# Patient Record
Sex: Male | Born: 1970 | Race: White | Hispanic: No | Marital: Married | Smoking: Never smoker
Health system: Southern US, Community
[De-identification: ages and names within clinical notes are randomized; demographics above are authoritative.]

## PROBLEM LIST (undated history)

## (undated) DIAGNOSIS — G43909 Migraine, unspecified, not intractable, without status migrainosus: Secondary | ICD-10-CM

## (undated) DIAGNOSIS — I509 Heart failure, unspecified: Secondary | ICD-10-CM

## (undated) DIAGNOSIS — E785 Hyperlipidemia, unspecified: Secondary | ICD-10-CM

## (undated) DIAGNOSIS — B2790 Infectious mononucleosis, unspecified without complication: Secondary | ICD-10-CM

## (undated) DIAGNOSIS — I5189 Other ill-defined heart diseases: Secondary | ICD-10-CM

## (undated) DIAGNOSIS — B259 Cytomegaloviral disease, unspecified: Secondary | ICD-10-CM

## (undated) HISTORY — DX: Hyperlipidemia, unspecified: E78.5

## (undated) HISTORY — DX: Infectious mononucleosis, unspecified without complication: B27.90

## (undated) HISTORY — PX: WISDOM TOOTH EXTRACTION: SHX21

## (undated) HISTORY — DX: Migraine, unspecified, not intractable, without status migrainosus: G43.909

## (undated) HISTORY — DX: Other ill-defined heart diseases: I51.89

## (undated) HISTORY — DX: Cytomegaloviral disease, unspecified: B25.9

## (undated) HISTORY — PX: COLONOSCOPY: SHX174

---

## 2008-07-20 ENCOUNTER — Emergency Department (HOSPITAL_COMMUNITY): Admission: EM | Admit: 2008-07-20 | Discharge: 2008-07-20 | Payer: Self-pay | Admitting: Emergency Medicine

## 2009-02-11 ENCOUNTER — Emergency Department (HOSPITAL_COMMUNITY): Admission: EM | Admit: 2009-02-11 | Discharge: 2009-02-12 | Payer: Self-pay | Admitting: Emergency Medicine

## 2009-02-21 ENCOUNTER — Encounter: Admission: RE | Admit: 2009-02-21 | Discharge: 2009-02-21 | Payer: Self-pay | Admitting: Family Medicine

## 2009-04-26 ENCOUNTER — Inpatient Hospital Stay (HOSPITAL_COMMUNITY): Admission: EM | Admit: 2009-04-26 | Discharge: 2009-04-27 | Payer: Self-pay | Admitting: Emergency Medicine

## 2010-09-01 ENCOUNTER — Encounter: Admission: RE | Admit: 2010-09-01 | Discharge: 2010-09-01 | Payer: Self-pay | Admitting: Family Medicine

## 2011-02-19 LAB — CSF CELL COUNT WITH DIFFERENTIAL
RBC Count, CSF: 0 /mm3
Tube #: 4
WBC, CSF: 2 /mm3 (ref 0–5)

## 2011-02-19 LAB — DIFFERENTIAL
Basophils Absolute: 0 10*3/uL (ref 0.0–0.1)
Basophils Relative: 0 % (ref 0–1)
Eosinophils Absolute: 0 10*3/uL (ref 0.0–0.7)
Eosinophils Relative: 0 % (ref 0–5)
Lymphocytes Relative: 20 % (ref 12–46)
Lymphs Abs: 0.9 10*3/uL (ref 0.7–4.0)
Monocytes Absolute: 0.3 10*3/uL (ref 0.1–1.0)
Monocytes Relative: 8 % (ref 3–12)
Neutro Abs: 3.2 10*3/uL (ref 1.7–7.7)
Neutrophils Relative %: 72 % (ref 43–77)

## 2011-02-19 LAB — CSF CULTURE W GRAM STAIN
Culture: NO GROWTH
Gram Stain: NONE SEEN

## 2011-02-19 LAB — COMPREHENSIVE METABOLIC PANEL
ALT: 19 U/L (ref 0–53)
AST: 23 U/L (ref 0–37)
Albumin: 3.8 g/dL (ref 3.5–5.2)
Alkaline Phosphatase: 39 U/L (ref 39–117)
BUN: 10 mg/dL (ref 6–23)
CO2: 29 mEq/L (ref 19–32)
Calcium: 9.2 mg/dL (ref 8.4–10.5)
Chloride: 101 mEq/L (ref 96–112)
Creatinine, Ser: 0.86 mg/dL (ref 0.4–1.5)
GFR calc Af Amer: >60 mL/min (ref >60)
GFR calc non Af Amer: >60 mL/min (ref >60)
Glucose, Bld: 139 mg/dL — ABNORMAL HIGH (ref 70–99)
Potassium: 3.7 mEq/L (ref 3.5–5.1)
Sodium: 138 mEq/L (ref 135–145)
Total Bilirubin: 0.7 mg/dL (ref 0.3–1.2)
Total Protein: 6.8 g/dL (ref 6.0–8.3)

## 2011-02-19 LAB — CULTURE, BLOOD (ROUTINE X 2)
Culture: NO GROWTH
Culture: NO GROWTH

## 2011-02-19 LAB — CBC
HCT: 39.9 % (ref 39.0–52.0)
Hemoglobin: 14 g/dL (ref 13.0–17.0)
MCHC: 35.1 g/dL (ref 30.0–36.0)
MCV: 83.6 fL (ref 78.0–100.0)
Platelets: 190 10*3/uL (ref 150–400)
RBC: 4.78 MIL/uL (ref 4.22–5.81)
RDW: 13.4 % (ref 11.5–15.5)
WBC: 4.5 10*3/uL (ref 4.0–10.5)

## 2011-02-19 LAB — ROCKY MTN SPOTTED FVR AB, IGG-BLOOD: RMSF IgG: 0.06 IV

## 2011-02-19 LAB — VDRL, CSF: VDRL Quant, CSF: NONREACTIVE

## 2011-02-19 LAB — PROTEIN, CSF: Total  Protein, CSF: 38 mg/dL (ref 15–45)

## 2011-02-19 LAB — CK: Total CK: 51 U/L (ref 7–232)

## 2011-02-19 LAB — GLUCOSE, CSF: Glucose, CSF: 66 mg/dL (ref 43–76)

## 2011-02-19 LAB — ROCKY MTN SPOTTED FVR AB, IGM-BLOOD: RMSF IgM: 0.11 IV (ref 0.00–0.89)

## 2011-02-21 LAB — URINALYSIS, ROUTINE W REFLEX MICROSCOPIC
Bilirubin Urine: NEGATIVE
Glucose, UA: NEGATIVE mg/dL
Hgb urine dipstick: NEGATIVE
Ketones, ur: NEGATIVE mg/dL
Nitrite: NEGATIVE
Protein, ur: NEGATIVE mg/dL
Specific Gravity, Urine: 1.013 (ref 1.005–1.030)
Urobilinogen, UA: 1 mg/dL (ref 0.0–1.0)
pH: 6.5 (ref 5.0–8.0)

## 2011-02-21 LAB — DIFFERENTIAL
Basophils Absolute: 0 10*3/uL (ref 0.0–0.1)
Basophils Relative: 0 % (ref 0–1)
Eosinophils Absolute: 0.2 10*3/uL (ref 0.0–0.7)
Eosinophils Relative: 3 % (ref 0–5)
Lymphocytes Relative: 56 % — ABNORMAL HIGH (ref 12–46)
Lymphs Abs: 4.5 10*3/uL — ABNORMAL HIGH (ref 0.7–4.0)
Monocytes Absolute: 0.5 10*3/uL (ref 0.1–1.0)
Monocytes Relative: 6 % (ref 3–12)
Neutro Abs: 2.8 10*3/uL (ref 1.7–7.7)
Neutrophils Relative %: 35 % — ABNORMAL LOW (ref 43–77)

## 2011-02-21 LAB — CBC
HCT: 33.9 % — ABNORMAL LOW (ref 39.0–52.0)
Hemoglobin: 12.2 g/dL — ABNORMAL LOW (ref 13.0–17.0)
MCHC: 36 g/dL (ref 30.0–36.0)
MCV: 84.4 fL (ref 78.0–100.0)
Platelets: 230 10*3/uL (ref 150–400)
RBC: 4.01 MIL/uL — ABNORMAL LOW (ref 4.22–5.81)
RDW: 13.8 % (ref 11.5–15.5)
WBC: 8.1 10*3/uL (ref 4.0–10.5)

## 2011-02-21 LAB — BASIC METABOLIC PANEL
BUN: 12 mg/dL (ref 6–23)
CO2: 24 mEq/L (ref 19–32)
Calcium: 8.6 mg/dL (ref 8.4–10.5)
Chloride: 103 mEq/L (ref 96–112)
Creatinine, Ser: 0.93 mg/dL (ref 0.4–1.5)
GFR calc Af Amer: >60 mL/min (ref >60)
GFR calc non Af Amer: >60 mL/min (ref >60)
Glucose, Bld: 107 mg/dL — ABNORMAL HIGH (ref 70–99)
Potassium: 3.9 mEq/L (ref 3.5–5.1)
Sodium: 134 mEq/L — ABNORMAL LOW (ref 135–145)

## 2011-02-21 LAB — MALARIA SMEAR

## 2011-03-27 NOTE — Discharge Summary (Signed)
NAME:  Kenneth Andrews, Kenneth Andrews            ACCOUNT NO.:  [id number]   MEDICAL RECORD NO.:  [id number]          PATIENT TYPE:  INP   LOCATION:  1441                         FACILITY:  [facility]   PHYSICIAN:  Corinna L. Lendell Caprice, MDDATE OF BIRTH:  1970/12/01   DATE OF ADMISSION:  04/26/2009  DATE OF DISCHARGE:  04/27/2009                               DISCHARGE SUMMARY   DISCHARGE DIAGNOSES:  1. Varicella infection.  2. Transient hypotension.   DISCHARGE MEDICATIONS:  1. Acyclovir 500 mg p.o. q.i.d. until gone.  2. Antihistamines as needed for itching.  3. Stop azithromycin.  4. Continue ibuprofen or Tylenol as needed for fever or pain.  5. Dilaudid 2 mg one to two p.o. q.4 hours p.r.n. severe pain.   CONDITION:  Stable.   ACTIVITY:  As tolerated but do not return to work until rash resolved.   DIET:  Plenty of liquids, regular.   FOLLOWUP:  With Dr. Laurine Blazer as needed.   CONSULTATIONS:  None.   PROCEDURES:  Lumbar puncture by ED physician.   LABS:  CBC unremarkable.  Complete metabolic panel significant for  glucose of 139, otherwise unremarkable.  CPK is 51.  Lumbar puncture  showed 2 white cells, zero red cells, CSF glucose was 66, CSF protein  38, CSF culture showed no white cells, no organisms and is pending.  Blood cultures pending.   SPECIAL STUDIES:  Radiology:  Chest x-ray showed no infiltrate.  Outpatient CAT scan of the brain done prior to admission showed nothing  acute.   HISTORY AND HOSPITAL COURSE:  Kenneth Andrews is a 40 year old white male  who began having headache and fever the day prior to presenting to the  emergency room.  He was seen in an Urgent Care Center the day prior to  admission and he had no rash at that time.  He was given a prescription  for Percocet and Z-Pak.  He had no relief from his headache and he  continued to spike fevers up to 102 - 103 and he called the Urgent Care  Center.  They recommended patient present to the emergency room.  The  patient initially was normotensive but then dropped into the 80s.  He  began developing an erythematous papular rash but when the ED physician  evaluated him he had no vesicular characteristics to the rash.  She also  did a lumbar puncture to rule out meningitis.  When I evaluated patient  his rash had progressed and he did have rare vesicular lesions on an  erythematous base.  He was admitted to telemetry as initially he was  tachycardic, especially in light of the hypotension.  He had no further  hypotension after fluid bolus.  He was placed in respiratory and contact  isolation.  He has never had varicella infection previously nor has he  had the vaccine.  He had no signs of a varicella pneumonia.  He was  started on acyclovir and antihistamines.  At the time of discharge his  blood pressure was normal.  He was still spiking intermittent fevers but  his headache was better.  He was tolerating a diet.  His rash has  progressed and there is quite a few more vesicular lesions.  I have  instructed him to stop his Z-Pak.  I have given him a prescription for  Dilaudid as the Percocet did not really help his headache much.   Total time on the day of discharge is 40 minutes.      Corinna L. Lendell Caprice, MD  Electronically Signed     CLS/MEDQ  D:  04/27/2009  T:  04/27/2009  Job:  [id number]   cc:   Johnn Hai, MS

## 2011-03-27 NOTE — H&P (Signed)
NAME:  Kenneth Andrews, Kenneth Andrews            ACCOUNT NO.:  [id number]   MEDICAL RECORD NO.:  [id number]          PATIENT TYPE:  INP   LOCATION:  0105                         FACILITY:  [facility]   PHYSICIAN:  Corinna L. Lendell Caprice, MDDATE OF BIRTH:  November 10, 1971   DATE OF ADMISSION:  04/26/2009  DATE OF DISCHARGE:                              HISTORY & PHYSICAL   CHIEF COMPLAINT:  Headache, fever, rash.   HISTORY OF PRESENT ILLNESS:  [name] is a 40 year old, white male  who was seen in [facility] in [facility] yesterday for  headache and fever.  He had a fever of 101.5, pulse 120, and a blood  pressure of 114/66 yesterday.  He had a CT scan of the brain without  contrast which was unremarkable.  He had normal white blood cell count  and complete metabolic panel.  He was given a Z-Pak and Percocet and  told to take ibuprofen and rest.  He apparently called back to the  Urgent Care Center who recommended that he present to the Emergency Room  today for continued symptoms.  He has since developed a pruritic rash  over his trunk, arms, and face.  He has never had varicella infection or  vaccine.  He has a young child, and about 2 weeks ago, there was a  birthday party with multiple kids none of which had known illness.  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  Laurine Blazer.  I have no records from her office.  The patient reports his  headache is frontal in nature.  He has no neck stiffness.  He feels  generalized malaise.  He is able to ambulate without difficulty.  He has  had no lightheadedness or dizziness.  He has no cough or shortness of  breath.   PAST MEDICAL HISTORY:  As above.   MEDICATIONS:  1. Azithromycin.  2. Percocet has provided no relief from his headache.   SOCIAL HISTORY:  He is married.  He does not smoke, drink, or use drugs.  He traveled to the [place] in [place].   FAMILY HISTORY:  His mother has metastatic breast cancer.   His maternal  grandmother had pancreatic cancer.  His father died of an MI in his 17s.   REVIEW OF SYSTEMS:  CONSTITUTIONAL:  As above.  No weight loss.  No  weight gain.  HEENT:  He also has some lesions developing on his tongue.  He has no sore throat, rhinorrhea, sinus congestion, visual changes.  RESPIRATORY:  As above.  CARDIOVASCULAR: No chest pains or palpitations.  GI:  His appetite has been good.  He has had no vomiting or diarrhea.  GU:  No dysuria or hematuria.  MUSCULOSKELETAL: He has myalgias as  stated above.  INFECTIOUS:  He reportedly was tested for HIV by Dr.  Laurine Blazer as well as other tests.  ENDOCRINE:  No diabetes.  HEMATOLOGIC:  No history of bleeding or clotting disorders.  SKIN:  As above.  PSYCHIATRIC:  No depression.  NEUROLOGIC:  No confusion, no seizures.   PHYSICAL EXAMINATION:  VITAL  SIGNS:  His temperature in the Emergency  Room was 100.9.  Blood pressure initially was 115/55 and dropped  transiently to 84/48 but currently back up to 110.  Heart rate initially  114, currently 90.  Respiratory rate 16-22.  Oxygen saturation 95% on  room air.  GENERAL:  The patient is in no acute distress.  He is talking on the  telephone and sitting upright with his legs crossed.  HEENT:  Pupils equal, round, and reactive to light.  Sclerae nonicteric.  No conjunctivitis.  Nares without drainage.  He has some vesicular  lesions over his tongue.  NECK:  Supple.  Shotty submandibular lymphadenopathy.  LUNGS:  Clear to auscultation bilaterally without wheezes, rhonchi, or  rales.  CARDIOVASCULAR:  Regular rate and rhythm without murmurs, gallops, or  rubs.  ABDOMEN:  Normal bowel sounds.  Soft, nontender, nondistended.  GU: The patient refused.  RECTAL:  The patient refused.  EXTREMITIES:  No clubbing, cyanosis, or edema.  SKIN:  The patient has multiple, erythematous papules and some with  crusting and some which are vesicular along his hairline, scalp, face,  ears, torso,  arms, hands, back, relative sparing of his legs and feet.  NEUROLOGIC:  Alert and oriented.  Cranial nerves and sensorimotor exam  are intact.  PSYCHIATRIC:  Normal affect.   LABS:  CBC normal with a normal differential.  White blood cell count is  4500.  Complete metabolic panel significant for a glucose of 139,  otherwise unremarkable.  Lumbar puncture done by Dr. Rosalia Hammers in tube 4 is  colorless, clear, 2 white cells, 0 red cells.  CSF glucose is 66.  CSF  protein 38.  Chest x-ray shows no infiltrate.  CT of the brain done as  an outpatient was normal with and without contrast.   ASSESSMENT/PLAN:  1. Varicella infection with transient hypotension.  The patient will      be admitted for IV fluids and monitoring.  He will be in a negative      pressure respiratory isolation and contact isolation area.  I have      notified the nurses to notify infection control nurse.  The patient      has received acyclovir which I will continue.  Also give      antihistamines and Tylenol.  For his headache, try Toradol and      Dilaudid as needed.  His pressure is currently better, and he does      not appear toxic.  Much less likely would be [facility] spotted      fever; however, for now, cover with doxycycline.  2. Reported history of cytomegalovirus.  We will get outpatient      records.      Corinna L. Lendell Caprice, MD  Electronically Signed     CLS/MEDQ  D:  04/26/2009  T:  04/26/2009  Job:  [id number]   cc:   Dr. Laurine Blazer

## 2011-08-15 LAB — DIFFERENTIAL
Basophils Absolute: 0
Basophils Relative: 0
Eosinophils Absolute: 0.1
Eosinophils Relative: 1
Lymphocytes Relative: 27
Lymphs Abs: 2.2
Monocytes Absolute: 0.5
Monocytes Relative: 6
Neutro Abs: 5.3
Neutrophils Relative %: 66

## 2011-08-15 LAB — COMPREHENSIVE METABOLIC PANEL
ALT: 16
AST: 17
Albumin: 4.1
Alkaline Phosphatase: 42
BUN: 11
CO2: 30
Calcium: 9.6
Chloride: 105
Creatinine, Ser: 0.78
GFR calc Af Amer: >60
GFR calc non Af Amer: >60
Glucose, Bld: 91
Potassium: 3.7
Sodium: 141
Total Bilirubin: 1.1
Total Protein: 7.1

## 2011-08-15 LAB — POCT CARDIAC MARKERS
CKMB, poc: 1.4
CKMB, poc: <1 — ABNORMAL LOW
Myoglobin, poc: 57.3
Myoglobin, poc: 72.2
Troponin i, poc: <0.05
Troponin i, poc: <0.05

## 2011-08-15 LAB — CBC
HCT: 43
Hemoglobin: 14.8
MCHC: 34.3
MCV: 85.9
Platelets: 274
RBC: 5.01
RDW: 13.2
WBC: 8.1

## 2011-11-25 ENCOUNTER — Emergency Department (HOSPITAL_BASED_OUTPATIENT_CLINIC_OR_DEPARTMENT_OTHER)
Admission: EM | Admit: 2011-11-25 | Discharge: 2011-11-25 | Disposition: A | Discharge status: Home / Self Care | Payer: BC Managed Care – PPO | Attending: Emergency Medicine | Admitting: Emergency Medicine

## 2011-11-25 ENCOUNTER — Emergency Department (INDEPENDENT_AMBULATORY_CARE_PROVIDER_SITE_OTHER): Payer: BC Managed Care – PPO

## 2011-11-25 ENCOUNTER — Other Ambulatory Visit: Payer: Self-pay

## 2011-11-25 ENCOUNTER — Encounter (HOSPITAL_BASED_OUTPATIENT_CLINIC_OR_DEPARTMENT_OTHER): Payer: Self-pay | Admitting: *Deleted

## 2011-11-25 DIAGNOSIS — R079 Chest pain, unspecified: Secondary | ICD-10-CM

## 2011-11-25 DIAGNOSIS — R42 Dizziness and giddiness: Secondary | ICD-10-CM | POA: Insufficient documentation

## 2011-11-25 LAB — BASIC METABOLIC PANEL
BUN: 14 mg/dL (ref 6–23)
CO2: 27 mEq/L (ref 19–32)
Calcium: 9.6 mg/dL (ref 8.4–10.5)
Chloride: 105 mEq/L (ref 96–112)
Creatinine, Ser: 0.9 mg/dL (ref 0.50–1.35)
GFR calc Af Amer: >90 mL/min (ref >90)
GFR calc non Af Amer: >90 mL/min (ref >90)
Glucose, Bld: 140 mg/dL — ABNORMAL HIGH (ref 70–99)
Potassium: 3.6 mEq/L (ref 3.5–5.1)
Sodium: 140 mEq/L (ref 135–145)

## 2011-11-25 LAB — DIFFERENTIAL
Basophils Absolute: 0 10*3/uL (ref 0.0–0.1)
Basophils Relative: 0 % (ref 0–1)
Eosinophils Absolute: 0.2 10*3/uL (ref 0.0–0.7)
Eosinophils Relative: 2 % (ref 0–5)
Lymphocytes Relative: 37 % (ref 12–46)
Lymphs Abs: 3.5 10*3/uL (ref 0.7–4.0)
Monocytes Absolute: 0.6 10*3/uL (ref 0.1–1.0)
Monocytes Relative: 7 % (ref 3–12)
Neutro Abs: 5 10*3/uL (ref 1.7–7.7)
Neutrophils Relative %: 54 % (ref 43–77)

## 2011-11-25 LAB — CBC
HCT: 40.5 % (ref 39.0–52.0)
Hemoglobin: 14.7 g/dL (ref 13.0–17.0)
MCH: 29.2 pg (ref 26.0–34.0)
MCHC: 36.3 g/dL — ABNORMAL HIGH (ref 30.0–36.0)
MCV: 80.5 fL (ref 78.0–100.0)
Platelets: 271 10*3/uL (ref 150–400)
RBC: 5.03 MIL/uL (ref 4.22–5.81)
RDW: 13 % (ref 11.5–15.5)
WBC: 9.3 10*3/uL (ref 4.0–10.5)

## 2011-11-25 LAB — TROPONIN I: Troponin I: <0.3 ng/mL (ref <0.30)

## 2011-11-25 MED ORDER — KETOROLAC TROMETHAMINE 30 MG/ML IJ SOLN
30.0000 mg | Freq: Once | INTRAMUSCULAR | Status: AC
  Administered 2011-11-25: 30 mg via INTRAVENOUS
  Filled 2011-11-25: qty 1

## 2011-11-25 NOTE — ED Notes (Addendum)
Pt states his dad died of a massive heart attack at age 41. Earlier today he was not doing anything and had a sudden onset of left chest pain radiating to left arm. Light-headed, but no other s/s. Taken to [facility]. EKG done. Pt placed on monitor.

## 2011-11-25 NOTE — ED Provider Notes (Signed)
History   This chart was scribed for Kenneth Christen, MD scribed by [name]. The patient was seen in room MH12/MH12 seen at 21:38.   CSN: [id number]  Arrival date & time 11/25/11  2058   First MD Initiated Contact with Patient 11/25/11 2121      Chief Complaint  Patient presents with  . Chest Pain    (Consider location/radiation/quality/duration/timing/severity/associated sxs/prior treatment) HPI Kenneth Andrews is a 41 y.o. male who presents to the Emergency Department complaining of gradually improving constant left chest pain with associated pain radiating to his left arm and lightheadedness. Pt reports he was watching his son playing in the family room when he experienced pain and lightheadedness lasting about 20-30 minutes. He adds that he took an aspirin with improvement of his left arm. Denies cough, fevers, SOB, difficulty breathing, and nausea, syncope, allergies or heart palpitations. Pt also denies taking any medications besides vitamins and an occasional baby aspirin. Pt has a family history of heart disease with his father passing at the age of 47 from a massive HA and his grandmother also had a HA in her 61s. Both pt reports were smokers. Pt says he has had normal stress tests and that they were done about 6-7 years ago.  PCP: Dr. Jennette Bill  History reviewed. No pertinent past medical history.  History reviewed. No pertinent past surgical history.  History reviewed. No pertinent family history.  History  Substance Use Topics  . Smoking status: Never Smoker   . Smokeless tobacco: Not on file  . Alcohol Use: No      Review of Systems  Constitutional: Negative.  Negative for fever and chills.  HENT: Negative.   Eyes: Negative.  Negative for discharge and redness.  Respiratory: Negative.  Negative for cough and shortness of breath.   Cardiovascular: Positive for chest pain.  Gastrointestinal: Negative.  Negative for nausea, vomiting and abdominal pain.    Genitourinary: Negative.  Negative for hematuria.  Musculoskeletal: Negative.  Negative for back pain.  Skin: Negative.  Negative for color change and rash.  Neurological: Positive for light-headedness. Negative for syncope and headaches.  Hematological: Negative.  Negative for adenopathy.  Psychiatric/Behavioral: Negative.  Negative for confusion.  All other systems reviewed and are negative.    Allergies  Review of patient's allergies indicates no known allergies.  Home Medications   Current Outpatient Rx  Name Route Sig Dispense Refill  . ASPIRIN 325 MG PO TBEC Oral Take 750 mg by mouth once as needed. For chest tightness      BP 144/86  Pulse 90  Temp(Src) 98.2 F (36.8 C) (Oral)  Resp 20  Ht 5\' 6"  (1.676 m)  Wt 180 lb (81.647 kg)  BMI 29.05 kg/m2  SpO2 97%  Physical Exam  Nursing note and vitals reviewed. Constitutional: He is oriented to person, place, and time. He appears well-developed and well-nourished. No distress.  HENT:  Head: Normocephalic and atraumatic.  Eyes: EOM are normal. Pupils are equal, round, and reactive to light.  Neck: Normal range of motion. Neck supple. No tracheal deviation present.  Cardiovascular: Normal rate, regular rhythm and normal heart sounds.  Exam reveals no gallop and no friction rub.   No murmur heard. Pulmonary/Chest: Effort normal and breath sounds normal. No respiratory distress. He has no wheezes. He has no rales.  Abdominal: Soft. Bowel sounds are normal. He exhibits no distension.  Musculoskeletal: Normal range of motion. He exhibits no edema.  Neurological: He is alert and oriented to person,  place, and time. No sensory deficit.  Skin: Skin is warm and dry.  Psychiatric: He has a normal mood and affect. His behavior is normal.    ED Course  Procedures (including critical care time) DIAGNOSTIC STUDIES: Oxygen Saturation is 97% on room air, normal by my interpretation.    COORDINATION OF CARE: Results for orders  placed during the hospital encounter of 11/25/11  CBC      Component Value Range   WBC 9.3  4.0 - 10.5 (K/uL)   RBC 5.03  4.22 - 5.81 (MIL/uL)   Hemoglobin 14.7  13.0 - 17.0 (g/dL)   HCT 16.1  09.6 - 04.5 (%)   MCV 80.5  78.0 - 100.0 (fL)   MCH 29.2  26.0 - 34.0 (pg)   MCHC 36.3 (*) 30.0 - 36.0 (g/dL)   RDW 40.9  81.1 - 91.4 (%)   Platelets 271  150 - 400 (K/uL)  DIFFERENTIAL      Component Value Range   Neutrophils Relative 54  43 - 77 (%)   Neutro Abs 5.0  1.7 - 7.7 (K/uL)   Lymphocytes Relative 37  12 - 46 (%)   Lymphs Abs 3.5  0.7 - 4.0 (K/uL)   Monocytes Relative 7  3 - 12 (%)   Monocytes Absolute 0.6  0.1 - 1.0 (K/uL)   Eosinophils Relative 2  0 - 5 (%)   Eosinophils Absolute 0.2  0.0 - 0.7 (K/uL)   Basophils Relative 0  0 - 1 (%)   Basophils Absolute 0.0  0.0 - 0.1 (K/uL)  BASIC METABOLIC PANEL      Component Value Range   Sodium 140  135 - 145 (mEq/L)   Potassium 3.6  3.5 - 5.1 (mEq/L)   Chloride 105  96 - 112 (mEq/L)   CO2 27  19 - 32 (mEq/L)   Glucose, Bld 140 (*) 70 - 99 (mg/dL)   BUN 14  6 - 23 (mg/dL)   Creatinine, Ser 7.82  0.50 - 1.35 (mg/dL)   Calcium 9.6  8.4 - 95.6 (mg/dL)   GFR calc non Af Amer >90  >90 (mL/min)   GFR calc Af Amer >90  >90 (mL/min)  TROPONIN I      Component Value Range   Troponin I <0.30  <0.30 (ng/mL)      No diagnosis found.   Date: 11/25/2011  Rate: 89  Rhythm: normal sinus rhythm  QRS Axis: normal  Intervals: normal  ST/T Wave abnormalities: normal  Conduction Disutrbances:none  Narrative Interpretation:   Old EKG Reviewed: unchanged from 07/20/2008    MDM  Patient with symptoms that are atypical for acute coronary syndrome given that his symptoms began at rest.  They were not worsened by any movement or exertion.  Patient is low risk by both the heart score criteria in the TIMI score criteria, he scores only a one in each of these scales for his family history and aspirin use.  Patient's symptoms have spontaneously  improved by the time of his arrival here.  He has a normal EKG and a normal troponin and given that his pain started at approximately 2:30 this afternoon and it was maximal at that time I do not feel the patient needs a repeat cardiac marker since it is 7 hours later.  Patient does have a primary care physician I will advise him to followup with his primary care physician this week for reevaluation and outpatient stress testing to be completed.  I will advise him to  return for worsening chest pain, shortness of breath or other concerns.   I personally performed the services described in this documentation, which was scribed in my presence. The recorded information has been reviewed and considered.        Kenneth Christen, MD 11/25/11 2206

## 2013-06-14 ENCOUNTER — Emergency Department (HOSPITAL_COMMUNITY): Payer: BC Managed Care – PPO

## 2013-06-14 ENCOUNTER — Encounter (HOSPITAL_COMMUNITY): Payer: Self-pay | Admitting: Emergency Medicine

## 2013-06-14 ENCOUNTER — Inpatient Hospital Stay (HOSPITAL_COMMUNITY)
Admission: EM | Admit: 2013-06-14 | Discharge: 2013-06-16 | DRG: 532 | Disposition: A | Discharge status: Home / Self Care | Payer: BC Managed Care – PPO | Attending: Internal Medicine | Admitting: Internal Medicine

## 2013-06-14 DIAGNOSIS — Z79899 Other long term (current) drug therapy: Secondary | ICD-10-CM

## 2013-06-14 DIAGNOSIS — Z7982 Long term (current) use of aspirin: Secondary | ICD-10-CM

## 2013-06-14 DIAGNOSIS — G458 Other transient cerebral ischemic attacks and related syndromes: Principal | ICD-10-CM | POA: Diagnosis present

## 2013-06-14 DIAGNOSIS — F411 Generalized anxiety disorder: Secondary | ICD-10-CM | POA: Diagnosis present

## 2013-06-14 DIAGNOSIS — I5032 Chronic diastolic (congestive) heart failure: Secondary | ICD-10-CM | POA: Diagnosis present

## 2013-06-14 DIAGNOSIS — I509 Heart failure, unspecified: Secondary | ICD-10-CM | POA: Diagnosis present

## 2013-06-14 DIAGNOSIS — G459 Transient cerebral ischemic attack, unspecified: Secondary | ICD-10-CM

## 2013-06-14 DIAGNOSIS — R079 Chest pain, unspecified: Secondary | ICD-10-CM | POA: Diagnosis present

## 2013-06-14 DIAGNOSIS — H532 Diplopia: Secondary | ICD-10-CM | POA: Diagnosis present

## 2013-06-14 DIAGNOSIS — E781 Pure hyperglyceridemia: Secondary | ICD-10-CM | POA: Diagnosis present

## 2013-06-14 DIAGNOSIS — R29810 Facial weakness: Secondary | ICD-10-CM | POA: Diagnosis present

## 2013-06-14 LAB — COMPREHENSIVE METABOLIC PANEL
ALT: 14 U/L (ref 0–53)
AST: 15 U/L (ref 0–37)
Albumin: 4.1 g/dL (ref 3.5–5.2)
Alkaline Phosphatase: 52 U/L (ref 39–117)
BUN: 13 mg/dL (ref 6–23)
CO2: 23 mEq/L (ref 19–32)
Calcium: 9.8 mg/dL (ref 8.4–10.5)
Chloride: 104 mEq/L (ref 96–112)
Creatinine, Ser: 0.84 mg/dL (ref 0.50–1.35)
GFR calc Af Amer: >90 mL/min (ref >90)
GFR calc non Af Amer: >90 mL/min (ref >90)
Glucose, Bld: 94 mg/dL (ref 70–99)
Potassium: 3.9 mEq/L (ref 3.5–5.1)
Sodium: 142 mEq/L (ref 135–145)
Total Bilirubin: 0.2 mg/dL — ABNORMAL LOW (ref 0.3–1.2)
Total Protein: 7.1 g/dL (ref 6.0–8.3)

## 2013-06-14 LAB — DIFFERENTIAL
Basophils Absolute: 0 10*3/uL (ref 0.0–0.1)
Basophils Relative: 0 % (ref 0–1)
Eosinophils Absolute: 0.2 10*3/uL (ref 0.0–0.7)
Eosinophils Relative: 2 % (ref 0–5)
Lymphocytes Relative: 44 % (ref 12–46)
Lymphs Abs: 3.8 10*3/uL (ref 0.7–4.0)
Monocytes Absolute: 0.5 10*3/uL (ref 0.1–1.0)
Monocytes Relative: 6 % (ref 3–12)
Neutro Abs: 4.1 10*3/uL (ref 1.7–7.7)
Neutrophils Relative %: 47 % (ref 43–77)

## 2013-06-14 LAB — CBC
HCT: 40.5 % (ref 39.0–52.0)
Hemoglobin: 14.7 g/dL (ref 13.0–17.0)
MCH: 30.4 pg (ref 26.0–34.0)
MCHC: 36.3 g/dL — ABNORMAL HIGH (ref 30.0–36.0)
MCV: 83.7 fL (ref 78.0–100.0)
Platelets: 278 10*3/uL (ref 150–400)
RBC: 4.84 MIL/uL (ref 4.22–5.81)
RDW: 12.9 % (ref 11.5–15.5)
WBC: 8.6 10*3/uL (ref 4.0–10.5)

## 2013-06-14 LAB — POCT I-STAT TROPONIN I: Troponin i, poc: 0.01 ng/mL (ref 0.00–0.08)

## 2013-06-14 LAB — APTT: aPTT: 32 seconds (ref 24–37)

## 2013-06-14 LAB — TROPONIN I: Troponin I: <0.3 ng/mL (ref <0.30)

## 2013-06-14 LAB — PROTIME-INR
INR: 1 (ref 0.00–1.49)
Prothrombin Time: 13 seconds (ref 11.6–15.2)

## 2013-06-14 MED ORDER — ASPIRIN EC 81 MG PO TBEC
81.0000 mg | DELAYED_RELEASE_TABLET | Freq: Every day | ORAL | Status: DC
  Administered 2013-06-15 – 2013-06-16 (×2): 81 mg via ORAL
  Filled 2013-06-14 (×2): qty 1

## 2013-06-14 NOTE — ED Notes (Addendum)
Pt arrived to ED via POV with wife and child. Pt A & O with c/o R sided facial droop noted @ 20 min ago.  pt states onset of L arm pain and L chest pain @ 1530 today, pt states he did have slight HA at same time but since has went away, pt states he did have "funny" feeling in L side of face.  Per wife Banker) pt never became disoriented, pt denies aphasia or difficulty swallowing. Pt A & O at this time R side facial droop noted, pt confirms L neck discomfort

## 2013-06-14 NOTE — ED Notes (Signed)
Arrived from [place] for ?stroke.  Rapid response and Dr. Leroy Kennedy

## 2013-06-14 NOTE — ED Provider Notes (Signed)
CSN: [id number]     Arrival date & time 06/14/13  2142 History     First MD Initiated Contact with Patient 06/14/13 2156     Chief Complaint  Patient presents with  . Facial Droop   (Consider location/radiation/quality/duration/timing/severity/associated sxs/prior Treatment) HPI Comments: New onset facial droop that started 20 minutes ago. Wife noticed while they were watching TV.   Patient is a 42 y.o. male presenting with neurologic complaint. The history is provided by the patient.  Neurologic Problem This is a new problem. The current episode started less than 1 hour ago. The problem occurs constantly. The problem has not changed since onset.Pertinent negatives include no chest pain, no abdominal pain, no headaches and no shortness of breath. Nothing aggravates the symptoms. Nothing relieves the symptoms. He has tried nothing for the symptoms.    History reviewed. No pertinent past medical history. History reviewed. No pertinent past surgical history. No family history on file. History  Substance Use Topics  . Smoking status: Never Smoker   . Smokeless tobacco: Not on file  . Alcohol Use: No    Review of Systems  Constitutional: Negative for fever and chills.  Respiratory: Negative for shortness of breath.   Cardiovascular: Negative for chest pain.  Gastrointestinal: Negative for abdominal pain.  Neurological: Negative for headaches.  All other systems reviewed and are negative.    Allergies  Review of patient's allergies indicates no known allergies.  Home Medications   Current Outpatient Rx  Name  Route  Sig  Dispense  Refill  . aspirin 325 MG EC tablet   Oral   Take 750 mg by mouth once as needed. For chest tightness          BP 124/76  Pulse 97  Temp(Src) 98.4 F (36.9 C) (Oral)  Resp 18  Ht 5\' 6"  (1.676 m)  Wt 175 lb (79.379 kg)  BMI 28.26 kg/m2  SpO2 100% Physical Exam  Nursing note and vitals reviewed. Constitutional: He is oriented to person,  place, and time. He appears well-developed and well-nourished. No distress.  HENT:  Head: Normocephalic and atraumatic.  Mouth/Throat: No oropharyngeal exudate.  Eyes: EOM are normal. Pupils are equal, round, and reactive to light.  Neck: Normal range of motion. Neck supple.  Cardiovascular: Normal rate and regular rhythm.  Exam reveals no friction rub.   No murmur heard. Pulmonary/Chest: Effort normal and breath sounds normal. No respiratory distress. He has no wheezes. He has no rales.  Abdominal: He exhibits no distension. There is no tenderness. There is no rebound.  Musculoskeletal: Normal range of motion. He exhibits no edema.  Neurological: He is alert and oriented to person, place, and time. A cranial nerve deficit is present. No sensory deficit. He exhibits normal muscle tone. Coordination and gait normal. GCS eye subscore is 4. GCS verbal subscore is 5. GCS motor subscore is 6.  Mild R facial droop. Mild loss of R nasolabial fold  Skin: He is not diaphoretic.    ED Course   Procedures (including critical care time)  Labs Reviewed - No data to display Ct Head Wo Contrast  06/14/2013   *RADIOLOGY REPORT*  Clinical Data: Facial droop, possible stroke  CT HEAD WITHOUT CONTRAST  Technique:  Contiguous axial images were obtained from the base of the skull through the vertex without contrast.  Comparison: None.  Findings: Mild atrophy for the patient's young age.  No acute intracranial hemorrhage, definite infarction, mass lesion, midline shift, herniation, hydrocephalus, or extra-axial fluid collection.  Gray-white matter differentiation maintained.  Cisterns patent.  No cerebellar abnormality.  Orbits unremarkable.  Mastoids clear. Sinuses demonstrate polypoid mucosal thickening in the right maxillary and left sphenoid sinuses.  IMPRESSION: No acute intracranial process.  Mild atrophy.  Findings called to Dr.Demarco Bacci at 10:20 p.m.   Original Report Authenticated By: Judie Petit. Shick, M.D.   1.  Facial droop     MDM  42 year old male with acute onset of R sided facial droop. Began about 20 minutes ago. Very mild R facial droop. Patient denies any facial tingling. He does report some dull L lateral chest pain previously.  Here, vitals stable. Patient with mild R facial droop. Forehead spared. Concern for central cause, Code Stroke initiated.  CT reviewed by me, read as negative. I spoke to the radiologist on the phone. I spoke with Dr. Leroy Kennedy, who accepted patient at [facility]. Transferred to [facility] urgently with [facility].  Dagmar Hait, MD 06/14/13 [phone]

## 2013-06-14 NOTE — Consult Note (Signed)
Referring Physician: [facility]    Chief Complaint: CODE STRKE: right face weakness and left arm numbness-pain.  HPI:                                                                                                                                         Kenneth Andrews is an 42 y.o. male, right handed, with no significant past medical history, transferred to [facility] ED as a code stroke with the above mentioned symptoms. He was last seen well at 8:50 pm today when developed chest pain followed by numbness and pain left upper extremity and then his wife noticed droopiness of the right face. He was taken to [facility] ED where he was reported to have right face weakness but no other focal findings. Never had similar symptoms before. Denies associated headache, vertigo, double vision, difficulty swallowing, unsteadiness, weakness left or right hemibody, slurred speech, language or vision impairment. Upon arrival to [facility] ED he had virtual resolution of his left sided numbness and NIHSS was 0. The overall duration of his symptoms was 30 minutes. CT brain showed no acute intracranial abnormality.   Date last known well: 06/14/13 Time last known well: 8:50 pm tPA Given: no, NIHSS 0 NIHSS: 0 MRS:   History reviewed. No pertinent past medical history.  History reviewed. No pertinent past surgical history.  No family history on file. Social History:  reports that he has never smoked. He does not have any smokeless tobacco history on file. He reports that he does not drink alcohol or use illicit drugs.  Allergies: No Known Allergies  Medications:                                                                                                                           I have reviewed the patient's current medications.  ROS:  History obtained from the patient  General ROS: negative for -  chills, fatigue, fever, night sweats, weight gain or weight loss Psychological ROS: negative for - behavioral disorder, hallucinations, memory difficulties, mood swings or suicidal ideation Ophthalmic ROS: negative for - blurry vision, double vision, eye pain or loss of vision ENT ROS: negative for - epistaxis, nasal discharge, oral lesions, sore throat, tinnitus or vertigo Allergy and Immunology ROS: negative for - hives or itchy/watery eyes Hematological and Lymphatic ROS: negative for - bleeding problems, bruising or swollen lymph nodes Endocrine ROS: negative for - galactorrhea, hair pattern changes, polydipsia/polyuria or temperature intolerance Respiratory ROS: negative for - cough, hemoptysis, shortness of breath or wheezing Cardiovascular ROS: negative for - dyspnea on exertion, edema or irregular heartbeat Gastrointestinal ROS: negative for - abdominal pain, diarrhea, hematemesis, nausea/vomiting or stool incontinence Genito-Urinary ROS: negative for - dysuria, hematuria, incontinence or urinary frequency/urgency Musculoskeletal ROS: negative for - joint swelling or muscular weakness Neurological ROS: as noted in HPI Dermatological ROS: negative for rash and skin lesion changes      Physical exam: pleasant male in no apparent distress.Blood pressure 116/77, pulse 88, temperature 98.4 F (36.9 C), temperature source Oral, resp. rate 13, height 5\' 6"  (1.676 m), weight 79.379 kg (175 lb), SpO2 98.00%.  Head: normocephalic. Neck: supple, no bruits, no JVD. Cardiac: no murmurs. Lungs: clear. Abdomen: soft, no tender, no mass. Extremities: no edema.  Neurologic Examination:                                                                                                      Mental Status: Alert, oriented, thought content appropriate.  Speech fluent without evidence of aphasia.  Able to follow 3 step commands without difficulty. Cranial Nerves: II: Discs flat bilaterally; Visual  fields grossly normal, pupils equal, round, reactive to light and accommodation III,IV, VI: ptosis not present, extra-ocular motions intact bilaterally V,VII: smile symmetric, facial light touch sensation normal bilaterally VIII: hearing normal bilaterally IX,X: gag reflex present XI: bilateral shoulder shrug XII: midline tongue extension Motor: Right : Upper extremity   5/5    Left:     Upper extremity   5/5  Lower extremity   5/5     Lower extremity   5/5 Tone and bulk:normal tone throughout; no atrophy noted Sensory: Pinprick and light touch intact throughout, bilaterally Deep Tendon Reflexes:  2 all over  Plantars: Right: downgoing   Left: downgoing Cerebellar: normal finger-to-nose,  normal heel-to-shin test Gait:  No ataxia. CV: pulses palpable throughout    Results for orders placed during the hospital encounter of 06/14/13 (from the past 48 hour(s))  CBC     Status: Abnormal   Collection Time    06/14/13  9:59 PM      Result Value Range   WBC 8.6  4.0 - 10.5 K/uL   RBC 4.84  4.22 - 5.81 MIL/uL   Hemoglobin 14.7  13.0 - 17.0 g/dL   HCT 96.0  45.4 - 09.8 %   MCV 83.7  78.0 - 100.0 fL   MCH 30.4  26.0 -  34.0 pg   MCHC 36.3 (*) 30.0 - 36.0 g/dL   RDW 16.1  09.6 - 04.5 %   Platelets 278  150 - 400 K/uL  DIFFERENTIAL     Status: None   Collection Time    06/14/13  9:59 PM      Result Value Range   Neutrophils Relative % 47  43 - 77 %   Neutro Abs 4.1  1.7 - 7.7 K/uL   Lymphocytes Relative 44  12 - 46 %   Lymphs Abs 3.8  0.7 - 4.0 K/uL   Monocytes Relative 6  3 - 12 %   Monocytes Absolute 0.5  0.1 - 1.0 K/uL   Eosinophils Relative 2  0 - 5 %   Eosinophils Absolute 0.2  0.0 - 0.7 K/uL   Basophils Relative 0  0 - 1 %   Basophils Absolute 0.0  0.0 - 0.1 K/uL  POCT I-STAT TROPONIN I     Status: None   Collection Time    06/14/13 10:12 PM      Result Value Range   Troponin i, poc 0.01  0.00 - 0.08 ng/mL   Comment 3            Comment: Due to the release kinetics  of cTnI,     a negative result within the first hours     of the onset of symptoms does not rule out     myocardial infarction with certainty.     If myocardial infarction is still suspected,     repeat the test at appropriate intervals.   Ct Head Wo Contrast  06/14/2013   *RADIOLOGY REPORT*  Clinical Data: Facial droop, possible stroke  CT HEAD WITHOUT CONTRAST  Technique:  Contiguous axial images were obtained from the base of the skull through the vertex without contrast.  Comparison: None.  Findings: Mild atrophy for the patient's young age.  No acute intracranial hemorrhage, definite infarction, mass lesion, midline shift, herniation, hydrocephalus, or extra-axial fluid collection. Gray-white matter differentiation maintained.  Cisterns patent.  No cerebellar abnormality.  Orbits unremarkable.  Mastoids clear. Sinuses demonstrate polypoid mucosal thickening in the right maxillary and left sphenoid sinuses.  IMPRESSION: No acute intracranial process.  Mild atrophy.  Findings called to Dr.Walden at 10:20 p.m.   Original Report Authenticated By: Judie Petit. Miles Costain, M.D.      Assessment: 42 y.o. male without known risk factors for stroke and new onset right face weakness and left upper extremity numbness-pain lasting for about 30 minutes. NIHSS 0 and unremarkable CT brain. 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 Possible TIA, but once again his symptoms don't fit anatomically. He is going to be admitted to medicine due his ongoing chest discomfort. Will get TIA work up.  Stroke Risk Factors -none  Plan: 1. HgbA1c, fasting lipid panel 2. MRI, MRA  of the brain without contrast 3. Echocardiogram 4. Carotid dopplers 5. Prophylactic therapy-Antiplatelet med: Aspirin - dose 81 mg daily 6. Risk factor modification 7. Telemetry monitoring 8. Frequent neuro checks 9. PT/OT SLP   Wyatt Portela, MD [facility] [phone]  06/14/2013, 10:38 PM

## 2013-06-14 NOTE — Code Documentation (Signed)
42 yo wm brought from [facility] via [facility] for Code Stroke to be evaluated.  Per pt he developed CP & Lt arm numbness around 1530 this afternoon, it resolved fairly quickly, but returned around dinner & the pt's wife suggested he come to the ED.  While in the [facility] he developed Rt side facial droop & Rt facial numbness.  Code stroke was called, CT scan & labwork completed & the pt was transported to [facility].  Pt's facial droop & numbness resolved prior to tx by EMS & the pt was NIH 0 on assessment in the [facility].  Pt was cont. To c/o Lt arm & chest pain.  Pt was downgraded to a TIA Alert & the [facility] EDP staff assumed care with a neuro consult.  Code stroke called 2159, pt arrival to [facility] 2229, LKW 2030, EDP exam [facility] 2144, stroke team arrival 2159, neurologist arrival 2159, Pt arrival in CT [facility] 2155, phlebotomist arrival [facility] 2210.  Admission pending to be determined by EDP

## 2013-06-14 NOTE — ED Notes (Addendum)
Spoke with [employer], no truck available, advise we call [facility] for transport. [facility] called for transport

## 2013-06-15 ENCOUNTER — Observation Stay (HOSPITAL_COMMUNITY): Payer: BC Managed Care – PPO

## 2013-06-15 DIAGNOSIS — R079 Chest pain, unspecified: Secondary | ICD-10-CM | POA: Diagnosis present

## 2013-06-15 DIAGNOSIS — G459 Transient cerebral ischemic attack, unspecified: Secondary | ICD-10-CM | POA: Diagnosis present

## 2013-06-15 DIAGNOSIS — R29810 Facial weakness: Secondary | ICD-10-CM

## 2013-06-15 DIAGNOSIS — E781 Pure hyperglyceridemia: Secondary | ICD-10-CM | POA: Diagnosis present

## 2013-06-15 LAB — PROTIME-INR
INR: 1 (ref 0.00–1.49)
Prothrombin Time: 13 seconds (ref 11.6–15.2)

## 2013-06-15 LAB — TROPONIN I
Troponin I: <0.3 ng/mL (ref <0.30)
Troponin I: <0.3 ng/mL (ref <0.30)
Troponin I: <0.3 ng/mL (ref <0.30)

## 2013-06-15 LAB — LIPID PANEL
Cholesterol: 166 mg/dL (ref 0–200)
HDL: 18 mg/dL — ABNORMAL LOW (ref >39)
LDL Cholesterol: UNDETERMINED mg/dL (ref 0–99)
Total CHOL/HDL Ratio: 9.2 RATIO
Triglycerides: 940 mg/dL — ABNORMAL HIGH (ref <150)
VLDL: UNDETERMINED mg/dL (ref 0–40)

## 2013-06-15 LAB — COMPREHENSIVE METABOLIC PANEL
ALT: 16 U/L (ref 0–53)
AST: 12 U/L (ref 0–37)
Albumin: 3.7 g/dL (ref 3.5–5.2)
Alkaline Phosphatase: 44 U/L (ref 39–117)
BUN: 14 mg/dL (ref 6–23)
CO2: 23 mEq/L (ref 19–32)
Calcium: 9.5 mg/dL (ref 8.4–10.5)
Chloride: 104 mEq/L (ref 96–112)
Creatinine, Ser: 0.72 mg/dL (ref 0.50–1.35)
GFR calc Af Amer: >90 mL/min (ref >90)
GFR calc non Af Amer: >90 mL/min (ref >90)
Glucose, Bld: 100 mg/dL — ABNORMAL HIGH (ref 70–99)
Potassium: 3.8 mEq/L (ref 3.5–5.1)
Sodium: 139 mEq/L (ref 135–145)
Total Bilirubin: 0.2 mg/dL — ABNORMAL LOW (ref 0.3–1.2)
Total Protein: 6.5 g/dL (ref 6.0–8.3)

## 2013-06-15 LAB — CBC
HCT: 38.6 % — ABNORMAL LOW (ref 39.0–52.0)
Hemoglobin: 13.9 g/dL (ref 13.0–17.0)
MCH: 29.9 pg (ref 26.0–34.0)
MCHC: 36 g/dL (ref 30.0–36.0)
MCV: 83 fL (ref 78.0–100.0)
Platelets: 240 10*3/uL (ref 150–400)
RBC: 4.65 MIL/uL (ref 4.22–5.81)
RDW: 13 % (ref 11.5–15.5)
WBC: 6.7 10*3/uL (ref 4.0–10.5)

## 2013-06-15 LAB — GLUCOSE, CAPILLARY
Glucose-Capillary: 99 mg/dL (ref 70–99)
Glucose-Capillary: 78 mg/dL (ref 70–99)

## 2013-06-15 LAB — TSH: TSH: 1.704 u[IU]/mL (ref 0.350–4.500)

## 2013-06-15 LAB — HEMOGLOBIN A1C
Hgb A1c MFr Bld: 5 % (ref <5.7)
Mean Plasma Glucose: 97 mg/dL (ref <117)

## 2013-06-15 MED ORDER — SIMVASTATIN 20 MG PO TABS
20.0000 mg | ORAL_TABLET | Freq: Every day | ORAL | Status: DC
  Administered 2013-06-15: 20 mg via ORAL
  Filled 2013-06-15 (×2): qty 1

## 2013-06-15 MED ORDER — LORAZEPAM 2 MG/ML IJ SOLN
2.0000 mg | Freq: Once | INTRAMUSCULAR | Status: AC
  Administered 2013-06-15: 2 mg via INTRAVENOUS
  Filled 2013-06-15: qty 1

## 2013-06-15 MED ORDER — LORAZEPAM 2 MG/ML IJ SOLN
1.0000 mg | Freq: Once | INTRAMUSCULAR | Status: AC
  Administered 2013-06-15: 1 mg via INTRAVENOUS
  Filled 2013-06-15: qty 1

## 2013-06-15 MED ORDER — SENNOSIDES-DOCUSATE SODIUM 8.6-50 MG PO TABS: 1.0000 | ORAL_TABLET | Freq: Every evening | ORAL | Status: DC | PRN

## 2013-06-15 MED ORDER — LORAZEPAM 2 MG/ML IJ SOLN: 1.0000 mg | Freq: Once | INTRAMUSCULAR | Status: DC

## 2013-06-15 MED ORDER — ENOXAPARIN SODIUM 40 MG/0.4ML ~~LOC~~ SOLN
40.0000 mg | SUBCUTANEOUS | Status: DC
  Administered 2013-06-15 – 2013-06-16 (×2): 40 mg via SUBCUTANEOUS
  Filled 2013-06-15 (×2): qty 0.4

## 2013-06-15 MED ORDER — GLUCOSE-VITAMIN C 4-6 GM-MG PO CHEW
CHEWABLE_TABLET | ORAL | Status: AC
  Filled 2013-06-15: qty 1

## 2013-06-15 NOTE — Progress Notes (Signed)
Stroke Team Progress Note  HISTORY Kenneth Andrews is an 42 y.o. male, right handed, with no significant past medical history, transferred to [facility] ED as a code stroke with the above mentioned symptoms.   He was last seen well at 8:50 pm today when developed chest pain followed by numbness and pain left upper extremity and then his wife noticed droopiness of the right face. He was taken to [facility] ED where he was reported to have right face weakness but no other focal findings He was transported to [facility]. Upon arrival to [facility] ED he had virtual resolution of his left sided numbness and NIHSS was 0. The overall duration of his symptoms was 30 minutes.    Never had similar symptoms before. Denies associated headache, vertigo, double vision, difficulty swallowing, unsteadiness, weakness left or right hemibody, slurred speech, language or vision impairment.  CT brain showed no acute intracranial abnormality.   Patient was not a TPA candidate secondary to resolustion of symptoms. He was admitted to the neuro floor for further evaluation and treatment.  SUBJECTIVE He is lying in bed. Nervous.  Overall he feels his condition is completely resolved.   OBJECTIVE Most recent Vital Signs: Filed Vitals:   06/15/13 0136 06/15/13 0350 06/15/13 0600 06/15/13 0913  BP: 122/64 123/68 112/64 129/74  Pulse: 66 57 62 64  Temp: 97.3 F (36.3 C) 97.8 F (36.6 C) 98 F (36.7 C) 98.2 F (36.8 C)  TempSrc: Oral Oral Oral Oral  Resp: 18 18 18 18   Height: 5\' 6"  (1.676 m)     Weight: 81.7 kg (180 lb 1.9 oz)     SpO2: 100% 100% 100% 100%   CBG (last 3)   Recent Labs  06/15/13 0634  GLUCAP 99    IV Fluid Intake:     MEDICATIONS  . aspirin EC  81 mg Oral Daily  . enoxaparin (LOVENOX) injection  40 mg Subcutaneous Q24H  . LORazepam  1 mg Intravenous Once  . simvastatin  20 mg Oral q1800   PRN:  senna-docusate  Diet:  General thin liquids Activity:  Activity as tolerated DVT Prophylaxis:  lovenox  CLINICALLY  SIGNIFICANT STUDIES Basic Metabolic Panel:  Recent Labs Lab 06/14/13 2159 06/15/13 0625  NA 142 139  K 3.9 3.8  CL 104 104  CO2 23 23  GLUCOSE 94 100*  BUN 13 14  CREATININE 0.84 0.72  CALCIUM 9.8 9.5   Liver Function Tests:  Recent Labs Lab 06/14/13 2159 06/15/13 0625  AST 15 12  ALT 14 16  ALKPHOS 52 44  BILITOT 0.2* 0.2*  PROT 7.1 6.5  ALBUMIN 4.1 3.7   CBC:  Recent Labs Lab 06/14/13 2159 06/15/13 0625  WBC 8.6 6.7  NEUTROABS 4.1  --   HGB 14.7 13.9  HCT 40.5 38.6*  MCV 83.7 83.0  PLT 278 240   Coagulation:  Recent Labs Lab 06/14/13 2159 06/15/13 0625  LABPROT 13.0 13.0  INR 1.00 1.00   Cardiac Enzymes:  Recent Labs Lab 06/14/13 2159 06/15/13 0150 06/15/13 0902  TROPONINI <0.30 <0.30 <0.30   Urinalysis: No results found for this basename: COLORURINE, APPERANCEUR, LABSPEC, PHURINE, GLUCOSEU, HGBUR, BILIRUBINUR, KETONESUR, PROTEINUR, UROBILINOGEN, NITRITE, LEUKOCYTESUR,  in the last 168 hours Lipid Panel    Component Value Date/Time   CHOL 166 06/15/2013 0150   TRIG 940* 06/15/2013 0150   HDL 18* 06/15/2013 0150   CHOLHDL 9.2 06/15/2013 0150   VLDL UNABLE TO CALCULATE IF TRIGLYCERIDE OVER 400 mg/dL 11/20/1476 2956   LDLCALC  UNABLE TO CALCULATE IF TRIGLYCERIDE OVER 400 mg/dL 11/17/1094 0454   [id number]--   Urine Drug Screen:   No results found for this basename: labopia, cocainscrnur, labbenz, amphetmu, thcu, labbarb    Alcohol Level: No results found for this basename: ETH,  in the last 168 hours  Ct Head Wo Contrast 06/14/2013  : No acute intracranial process.  Mild atrophy.    MRI of the brain    MRA of the brain    2D Echocardiogram    Carotid Doppler  Carotid Dopplers completed. Preliminary report: There i1-39% ICA stenosis. Vertebral artery flow is antegrade.   CXR    EKG  normal sinus rhythm.   Therapy Recommendations   Physical Exam   Anxious young male not in distress.Awake alert. Afebrile. Head is nontraumatic. Neck is supple  without bruit. Hearing is normal. Cardiac exam no murmur or gallop. Lungs are clear to auscultation. Distal pulses are well felt. Neurological Exam ; awake alert oriented x3 with normal speech and language function. Extraocular movements are full range without nystagmus. Fundi were not visualized. Vision acuity and fields appear normal. Mild asymmetry of the right nasolabial fold when he smiles. Able to close eyes symmetrically. Tongue is midline. Palatal movements are normal. Motor system exam reveals no upper or lower extremity drift. Symmetric and equal strength in all 4 extremities. Deep tendon reflexes are 1+ symmetric. Plantars are downgoing. Sensation appears intact. Gait was not tested.  ASSESSMENT [name] is a 42 y.o. male presenting with chest pain then Left upper extremity numbness, right facial droop. CT imaging does not confirm an acute infarct; however MRI is pending. Patient is wanting to try again to do the MRI but wants to be medicated for him being nervous about the test; otherwise, he can do the open MRI here in town as outpatient. Infarct felt to be embolic secondary to unknown source.  On no daily aspirin prior to admission. Now on aspirin 81 mg orally every day for secondary stroke prevention. Patient with resultant resolution of symptoms. Work up underway.   Chest pain  Hypertriglyceridemia   Hospital day # 1  TREATMENT/PLAN  Continue aspirin 81 mg orally every day for secondary stroke prevention.  Patient denies being diabetic or being on lipid lowering agents. Recommendation of fibrates due to elevated triglycerides  Await MR/Echo results.   Cardiac testing  Gwendolyn Lima. Manson Passey, [facility], MBA, MHA [facility] Pager: [phone] 06/15/2013 2:55 PM   I have personally obtained a history, examined the patient, evaluated imaging results, and formulated the assessment and plan of care. I agree with the above. Delia Heady, MD

## 2013-06-15 NOTE — Progress Notes (Signed)
Echo Lab  2D Echocardiogram completed.  Deziree Mokry L Redith Drach, RDCS 06/15/2013 3:01 PM

## 2013-06-15 NOTE — Progress Notes (Signed)
UR COMPLETED  

## 2013-06-15 NOTE — Progress Notes (Addendum)
Followup note  Patient admitted early this morning. Workup in progress. Echo results pending. Preliminary report for Dopplers show minimal if any stenosis. Unable to get MRI this morning significant anxiety. Will attempt again tonight with more Ativan. If this does not work, could potentially discharge home tomorrow with a scheduled appointment for open MRI. In the meantime, plan on statin and fibrate plus aspirin daily. Enzymes x3 negative for chest pain so far EKG normal.

## 2013-06-15 NOTE — H&P (Signed)
[facility] History and Physical  Patient: Kenneth Andrews  ZOX:[id number]  DOB: 30-Oct-1971  DOA: 06/14/2013  Referring physician: Dr. Tanna Savoy PCP: No primary provider on file.  Specialists: Dr. Cyril Mourning  Chief Complaint: Left-sided chest pain, right-sided facial drop  HPI: Danen Lapaglia is a 42 y.o. male with no significant Past medical history, presented to [facility] long hospital with the complaint of chest pain.  Patient mentions that at around 3:30 PM he had an episode of chest pain which was on the left side felt like a dull ache resolved on its own within a few minutes there was no associated fever chills cough shortness of breath palpitations diaphoresis nausea or vomiting. The chest pain recurred in the evening. At this time the wife also noted that he has droopiness of the right side of the face. Along with that he had some numbness in his left arm. Patient went to the [facility] long hospital, from there he was transferred to [facility] as code stroke. On arrival his symptoms were completely resolved. At my evaluation he did not have any complain. He mentions he had similar chest pains in the past for which he has been worked up with a stress test which was negative in early 2013.  Review of Systems: as mentioned in the history of present illness.  A Comprehensive review of the other systems is negative.  History reviewed. No pertinent past medical history. History reviewed. No pertinent past surgical history. Social History:  reports that he has never smoked. He does not have any smokeless tobacco history on file. He reports that he does not drink alcohol or use illicit drugs. Patient is coming from home. Independent for most of his  ADL.  No Known Allergies  No family history on file.  Prior to Admission medications   Medication Sig Start Date End Date Taking? Authorizing Provider  aspirin 325 MG EC tablet Take 750 mg by mouth once as needed. For chest tightness    Yes Historical Provider, MD    Physical Exam: Filed Vitals:   06/14/13 2330 06/15/13 0000 06/15/13 0030 06/15/13 0100  BP:    110/63  Pulse: 74 74 78 75  Temp:      TempSrc:      Resp: 21 17 27 14   Height:      Weight:      SpO2: 98% 100% 98% 100%    General: Alert, Awake and Oriented to Time, Place and Person. Appear in no distress Eyes: PERRL ENT: Oral Mucosa clear moist. Neck:   no JVD, no  Carotid Bruits  Cardiovascular: S1 and S2 Present, no Murmur, Peripheral Pulses Present Respiratory: Bilateral Air entry equal and Decreased, Clear to Auscultation,  Abdomen: Bowel Sound Present, Soft and Non tender Skin:  Rash Extremities: Trace  Pedal edema, no  calf tenderness Neurologic: Mental status, Cranial Nerves, Motor strength, Sensation, reflexes, Proprioception , Cerebellar test grossly normal.   Labs on Admission:  CBC:  Recent Labs Lab 06/14/13 2159  WBC 8.6  NEUTROABS 4.1  HGB 14.7  HCT 40.5  MCV 83.7  PLT 278    CMP     Component Value Date/Time   NA 142 06/14/2013 2159   K 3.9 06/14/2013 2159   CL 104 06/14/2013 2159   CO2 23 06/14/2013 2159   GLUCOSE 94 06/14/2013 2159   BUN 13 06/14/2013 2159   CREATININE 0.84 06/14/2013 2159   CALCIUM 9.8 06/14/2013 2159   PROT 7.1 06/14/2013 2159   ALBUMIN 4.1 06/14/2013  2159   AST 15 06/14/2013 2159   ALT 14 06/14/2013 2159   ALKPHOS 52 06/14/2013 2159   BILITOT 0.2* 06/14/2013 2159   GFRNONAA >90 06/14/2013 2159   GFRAA >90 06/14/2013 2159    No results found for this basename: LIPASE, AMYLASE,  in the last 168 hours No results found for this basename: AMMONIA,  in the last 168 hours  Cardiac Enzymes:  Recent Labs Lab 06/14/13 2159  TROPONINI <0.30    BNP (last 3 results) No results found for this basename: PROBNP,  in the last 8760 hours  Radiological Exams on Admission: Ct Head Wo Contrast  06/14/2013   *RADIOLOGY REPORT*  Clinical Data: Facial droop, possible stroke  CT HEAD WITHOUT CONTRAST  Technique:  Contiguous axial  images were obtained from the base of the skull through the vertex without contrast.  Comparison: None.  Findings: Mild atrophy for the patient's young age.  No acute intracranial hemorrhage, definite infarction, mass lesion, midline shift, herniation, hydrocephalus, or extra-axial fluid collection. Gray-white matter differentiation maintained.  Cisterns patent.  No cerebellar abnormality.  Orbits unremarkable.  Mastoids clear. Sinuses demonstrate polypoid mucosal thickening in the right maxillary and left sphenoid sinuses.  IMPRESSION: No acute intracranial process.  Mild atrophy.  Findings called to Dr.Walden at 10:20 p.m.   Original Report Authenticated By: Judie Petit. Miles Costain, M.D.    EKG: Independently reviewed. normal EKG, normal sinus rhythm, unchanged from previous tracings.  Assessment/Plan Principal Problem:   TIA (transient ischemic attack) Active Problems:   Chest pain   1. TIA (transient ischemic attack) The patient's symptoms currently have completely resolved. His initial CT scan does not show any significant acute abnormality. Neurology has recommended stroke workup for the patient which is on its way. Patient does not appear to be at present requiring any physical therapy or speech therapy. Neuro checks.  2. chest pain Less likely cardiac in nature. We will follow serial troponins, telemetry, echocardiogram. Aspirin added We will check lipid profile and HbA1c.  DVT Prophylaxis: subcutaneous Heparin Nutrition: Cardiac   Code Status: Full   Family Communication: Wife was at the bedside and informed about the plan   Author: Lynden Oxford, MD [facility] Pager: [phone] 06/15/2013, 1:35 AM    If 7PM-7AM, please contact night-coverage [email or web address] Password [email or web address]

## 2013-06-15 NOTE — Progress Notes (Signed)
Shift event: RN paged this NP around shift change stating pt had c/o return of double vision prior to going for MRI/MRA. Upon return from tests, the double vision had resolved, but pt now has return of mild facial droop. There are no other neuro changes per RN. This NP paged neuro on call, Dr. Leroy Kennedy, to discuss symptoms and results of MRI/MRA. Dr. Leroy Kennedy said NIH score hasn't changed much, so for now, will cont present treatment plan and cont to watch pt overnight. Pt conts on ASA 81mg  and statin per neuro's earlier visit today. RN to call this NP for any further changes in stroke score or neuro exam.  Jimmye Norman, NP [facility]

## 2013-06-15 NOTE — Progress Notes (Signed)
Ativan 1mg  via IV given prior to MRI for claustrophobia

## 2013-06-15 NOTE — Progress Notes (Signed)
VASCULAR LAB PRELIMINARY  PRELIMINARY  PRELIMINARY  PRELIMINARY  Carotid Dopplers completed.    Preliminary report:  There is 1-39% ICA stenosis.  Vertebral artery flow is antegrade.  Montey Ebel, RVT 06/15/2013, 2:42 PM

## 2013-06-15 NOTE — ED Notes (Signed)
Admitting physician at bedside

## 2013-06-16 DIAGNOSIS — G459 Transient cerebral ischemic attack, unspecified: Secondary | ICD-10-CM

## 2013-06-16 DIAGNOSIS — I5032 Chronic diastolic (congestive) heart failure: Secondary | ICD-10-CM | POA: Diagnosis present

## 2013-06-16 DIAGNOSIS — R079 Chest pain, unspecified: Secondary | ICD-10-CM

## 2013-06-16 DIAGNOSIS — E781 Pure hyperglyceridemia: Secondary | ICD-10-CM

## 2013-06-16 DIAGNOSIS — I509 Heart failure, unspecified: Secondary | ICD-10-CM

## 2013-06-16 LAB — PRO B NATRIURETIC PEPTIDE: Pro B Natriuretic peptide (BNP): 44.2 pg/mL (ref 0–125)

## 2013-06-16 MED ORDER — ASPIRIN 81 MG PO TBEC: 81.0000 mg | DELAYED_RELEASE_TABLET | Freq: Every day | ORAL | Status: DC

## 2013-06-16 MED ORDER — FENOFIBRATE 48 MG PO TABS: 48.0000 mg | ORAL_TABLET | Freq: Every day | ORAL | Status: DC

## 2013-06-16 MED ORDER — LIVING BETTER WITH HEART FAILURE BOOK
Freq: Once | Status: AC
  Administered 2013-06-16: 11:00:00
  Filled 2013-06-16: qty 1

## 2013-06-16 MED ORDER — LISINOPRIL 2.5 MG PO TABS: 2.5000 mg | ORAL_TABLET | Freq: Every day | ORAL | Status: DC

## 2013-06-16 NOTE — Progress Notes (Signed)
Stroke Team Progress Note  HISTORY Kenneth Andrews is an 42 y.o. male, right handed, with no significant past medical history, transferred to [facility] ED as a code stroke with the above mentioned symptoms.   He was last seen well at 8:50 pm today when developed chest pain followed by numbness and pain left upper extremity and then his wife noticed droopiness of the right face. He was taken to [facility] ED where he was reported to have right face weakness but no other focal findings He was transported to [facility]. Upon arrival to [facility] ED he had virtual resolution of his left sided numbness and NIHSS was 0. The overall duration of his symptoms was 30 minutes.    Never had similar symptoms before. Denies associated headache, vertigo, double vision, difficulty swallowing, unsteadiness, weakness left or right hemibody, slurred speech, language or vision impairment.  CT brain showed no acute intracranial abnormality.   Patient was not a TPA candidate secondary to resolustion of symptoms. He was admitted to the neuro floor for further evaluation and treatment.  SUBJECTIVE He is lying in bed. Nervous.  Overall he feels his condition is completely resolved.   OBJECTIVE Most recent Vital Signs: Filed Vitals:   06/15/13 2144 06/16/13 0123 06/16/13 0613 06/16/13 0720  BP: 118/69 107/67 92/60 112/78  Pulse: 76 64 81   Temp: 97.9 F (36.6 C) 98 F (36.7 C) 97.4 F (36.3 C)   TempSrc: Oral Oral Oral   Resp: 18 20 20    Height:      Weight:      SpO2: 100% 100% 100%    CBG (last 3)   Recent Labs  06/15/13 0634 06/15/13 1141  GLUCAP 99 78    IV Fluid Intake:     MEDICATIONS  . aspirin EC  81 mg Oral Daily  . enoxaparin (LOVENOX) injection  40 mg Subcutaneous Q24H  . LORazepam  1 mg Intravenous Once  . simvastatin  20 mg Oral q1800   PRN:  senna-docusate  Diet:  General thin liquids Activity:  Activity as tolerated DVT Prophylaxis:  lovenox  CLINICALLY SIGNIFICANT STUDIES Basic Metabolic Panel:    Recent Labs Lab 06/14/13 2159 06/15/13 0625  NA 142 139  K 3.9 3.8  CL 104 104  CO2 23 23  GLUCOSE 94 100*  BUN 13 14  CREATININE 0.84 0.72  CALCIUM 9.8 9.5   Liver Function Tests:   Recent Labs Lab 06/14/13 2159 06/15/13 0625  AST 15 12  ALT 14 16  ALKPHOS 52 44  BILITOT 0.2* 0.2*  PROT 7.1 6.5  ALBUMIN 4.1 3.7   CBC:   Recent Labs Lab 06/14/13 2159 06/15/13 0625  WBC 8.6 6.7  NEUTROABS 4.1  --   HGB 14.7 13.9  HCT 40.5 38.6*  MCV 83.7 83.0  PLT 278 240   Coagulation:   Recent Labs Lab 06/14/13 2159 06/15/13 0625  LABPROT 13.0 13.0  INR 1.00 1.00   Cardiac Enzymes:   Recent Labs Lab 06/15/13 0150 06/15/13 0902 06/15/13 1602  TROPONINI <0.30 <0.30 <0.30   Urinalysis: No results found for this basename: COLORURINE, APPERANCEUR, LABSPEC, PHURINE, GLUCOSEU, HGBUR, BILIRUBINUR, KETONESUR, PROTEINUR, UROBILINOGEN, NITRITE, LEUKOCYTESUR,  in the last 168 hours Lipid Panel    Component Value Date/Time   CHOL 166 06/15/2013 0150   TRIG 940* 06/15/2013 0150   HDL 18* 06/15/2013 0150   CHOLHDL 9.2 06/15/2013 0150   VLDL UNABLE TO CALCULATE IF TRIGLYCERIDE OVER 400 mg/dL 11/17/1094 0454   LDLCALC UNABLE TO CALCULATE  IF TRIGLYCERIDE OVER 400 mg/dL 0/06/6577 4696   EXBM8U--1.3   Urine Drug Screen:   No results found for this basename: labopia,  cocainscrnur,  labbenz,  amphetmu,  thcu,  labbarb    Alcohol Level: No results found for this basename: ETH,  in the last 168 hours  Ct Head Wo Contrast 06/14/2013  : No acute intracranial process.  Mild atrophy.    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 This  configuration may explain the slight decreased caliber of the left  internal carotid artery as versus the right. Proximal stenosis of  the left internal carotid artery cannot be entirely excluded as a  cause of this finding.  Anterior circulation without medium or large size vessel   significant stenosis or occlusion.  Fetal type origin of the posterior cerebral arteries may explain  the small caliber of the vertebral arteries and basilar artery.  Artifact versus mild to moderate focal narrowing distal right  vertebral artery.  Nonvisualization PICAs with large well visualized AICAs  bilaterally.  Mild irregularity of the basilar artery without high-grade  stenosis.  Mild narrowing and irregularity of the posterior cerebral artery  distal branches.    2D Echocardiogram  EF 50%, wall motion normal.  Carotid Doppler  Carotid Dopplers completed. Preliminary report: There i1-39% ICA stenosis. Vertebral artery flow is antegrade.   CXR    EKG  normal sinus rhythm.   Therapy Recommendations   Physical Exam   Anxious young male not in distress.Awake alert. Afebrile. Head is nontraumatic. Neck is supple without bruit. Hearing is normal. Cardiac exam no murmur or gallop. Lungs are clear to auscultation. Distal pulses are well felt. Neurological Exam ; awake alert oriented x3 with normal speech and language function. Extraocular movements are full range without nystagmus. Fundi were not visualized. Vision acuity and fields appear normal. Mild asymmetry of the right nasolabial fold when he smiles. Able to close eyes symmetrically. Tongue is midline. Palatal movements are normal. Motor system exam reveals no upper or lower extremity drift. Symmetric and equal strength in all 4 extremities. Deep tendon reflexes are 1+ symmetric. Plantars are downgoing. Sensation appears intact. Gait was not tested.  ASSESSMENT [name] is a 42 y.o. male presenting with chest pain then Left upper extremity numbness, right facial droop. CT imaging does not confirm an acute infarct; however MRI is pending. Patient is wanting to try again to do the MRI but wants to be medicated for him being nervous about the test; otherwise, he can do the open MRI here in town as outpatient. Infarct felt  to be embolic secondary to unknown source.  On no daily aspirin prior to admission. Now on aspirin 81 mg orally every day for secondary stroke prevention. Patient with resultant resolution of symptoms. Work up underway.   Chest pain  Hypertriglyceridemia   Hospital day # 2  TREATMENT/PLAN  Continue aspirin 81 mg orally every day for secondary stroke prevention.  Patient denies being diabetic or being on lipid lowering agents. Recommendation of fibrates due to elevated triglycerides  Have patient follow up with primary MD or can follow up with stroke clinic in 2 months if primary prefers.  Gwendolyn Lima. Manson Passey, PAC, MBA, MHA [facility] Pager: [phone]   I have personally obtained a history, examined the patient, evaluated imaging results, and formulated the assessment and plan of care. I agree with the  above. Delia Heady, MD 06/16/2013 10:51 AM

## 2013-06-16 NOTE — Discharge Summary (Signed)
Physician Discharge Summary  Kenneth Andrews RUE:[id number] DOB: [birth year] DOA: 06/14/2013  PCP: Emeterio Reeve, MD  Admit date: 06/14/2013 Discharge date: 06/16/2013  Time spent: 35 minutes  Recommendations for Outpatient Follow-up:  1. Patient has an appointment with his PCP for tomorrow 8/6 2. Patient will start on a daily aspirin. 3. Patient will start on TriCor 45 daily for hypertriglyceridemia Patient will start on low-dose ACE inhibitor daily for chronic diastolic heart failure. His PCP will followup to decide if he can tolerate a low-dose beta blocker as well.  Discharge Diagnoses:  Principal Problem:   TIA (transient ischemic attack) Active Problems:   Chest pain   Hypertriglyceridemia   Chronic diastolic congestive heart failure   Discharge Condition: Improved, being discharged home  Diet recommendation: Heart healthy  Filed Weights   06/14/13 2153 06/15/13 0136  Weight: 79.379 kg (175 lb) 81.7 kg (180 lb 1.9 oz)    History of present illness:  On 8/4:Kenneth Andrews is a 42 y.o. male with no significant Past medical history, presented to [facility] long hospital with the complaint of chest pain.  Patient mentions that at around 3:30 PM he had an episode of chest pain which was on the left side felt like a dull ache resolved on its own within a few minutes there was no associated fever chills cough shortness of breath palpitations diaphoresis nausea or vomiting. The chest pain recurred in the evening. At this time the wife also noted that he has droopiness of the right side of the face. Along with that he had some numbness in his left arm.  Patient went to the [facility] long hospital, from there he was transferred to [facility] as code stroke. On arrival his symptoms were completely resolved. At my evaluation he did not have any complain.  He mentions he had similar chest pains in the past for which he has been worked up with a stress test which was negative in early  2013.   Hospital Course:  Principal Problem:   TIA (transient ischemic attack): Patient underwent a stroke workup. MRI of/MRA was negative. CT scan of head was negative. Carotid Dopplers were negative. Echocardiogram was negative for clot. Cholesterol panel noted significant hypertriglyceridemia. On the evening of hospital day 1, patient started complaining of some double vision as well as a mild facial droop on the other side. Patient is had already been on aspirin. Neurology followed and an evaluation of the patient felt that this may not necessarily be a TIA and may be more stress related. Regardless, they recommended stratification of patient's sole risk factor which was his cholesterol and continue daily aspirin for now.  Active Problems:   Chest pain: Enzymes x3 were normal. EKG was unremarkable. Pain is atypical and not felt to be cardiac in nature.    Hypertriglyceridemia: Recommend patient start TriCor daily. Prescription on discharge.    Chronic diastolic congestive heart failure: On echocardiogram, patient was noted to have grade 1 diastolic dysfunction. New diagnosis for the patient. Systolic function was preserved. BNP was checked and found to be normal. Patient was given education about CHF. Is being discharged on a low-dose ACE inhibitor. At times, patient's heart rate ranged from 50s to 90s. 90s when he was feeling very anxious, and 50s were likely from when he received Ativan for anxiety before MRI. Regardless, we'll defer to patient's primary care physician about trial of beta blocker as outpatient as baseline for recommendations for CHF.   Procedures:  Carotid Dopplers: No signs of  any carotid artery stenosis  Echocardiogram: Grade 1 diastolic dysfunction  Consultations:  Neurology/stroke service  Discharge Exam: Filed Vitals:   06/16/13 0720  BP: 112/78  Pulse:   Temp:   Resp:     General: Alert and oriented x3, no acute distress Cardiovascular: Regular rate and  rhythm, S1-S2 Respiratory: Clear to auscultation bilaterally Abdomen: Soft, nontender, nondistended, positive bowel sounds Extremities: No clubbing cyanosis or edema  Discharge Instructions  Discharge Orders   Future Orders Complete By Expires     Diet - low sodium heart healthy  As directed     Increase activity slowly  As directed         Medication List         aspirin 81 MG EC tablet  Take 1 tablet (81 mg total) by mouth daily.     fenofibrate 48 MG tablet  Commonly known as:  TRICOR  Take 1 tablet (48 mg total) by mouth daily.       No Known Allergies    The results of significant diagnostics from this hospitalization (including imaging, microbiology, ancillary and laboratory) are listed below for reference.    Significant Diagnostic Studies: Ct Head Wo Contrast  06/14/2013     IMPRESSION: No acute intracranial process.  Mild atrophy.  Findings called to Dr.Walden at 10:20 p.m.   Original Report Authenticated By: Judie Petit. Miles Costain, M.D.   Mr Kenneth Andrews Head Wo Contrast  06/15/2013      Please see above.  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  This configuration may explain the slight decreased caliber of the left internal carotid artery as versus the right.  Proximal stenosis of the left internal carotid artery cannot be entirely excluded as a cause of this finding.  Anterior circulation without medium or large size vessel significant stenosis or occlusion.  Fetal type origin of the posterior cerebral arteries may explain the small caliber of the vertebral arteries and basilar artery.  Artifact versus mild to moderate focal narrowing distal right vertebral artery.  Nonvisualization PICAs with large well visualized AICAs bilaterally.  Mild irregularity of the basilar artery without high-grade stenosis.  Mild narrowing and irregularity of the posterior cerebral artery distal branches.   Original Report Authenticated By: Lacy Duverney, M.D.   Mr  Brain Wo Contrast  06/15/2013     IMPRESSION: No acute infarct.  Please see above.   Microbiology: No results found for this or any previous visit (from the past 240 hour(s)).   Labs: Basic Metabolic Panel:  Recent Labs Lab 06/14/13 2159 06/15/13 0625  NA 142 139  K 3.9 3.8  CL 104 104  CO2 23 23  GLUCOSE 94 100*  BUN 13 14  CREATININE 0.84 0.72  CALCIUM 9.8 9.5   Liver Function Tests:  Recent Labs Lab 06/14/13 2159 06/15/13 0625  AST 15 12  ALT 14 16  ALKPHOS 52 44  BILITOT 0.2* 0.2*  PROT 7.1 6.5  ALBUMIN 4.1 3.7   CBC:  Recent Labs Lab 06/14/13 2159 06/15/13 0625  WBC 8.6 6.7  NEUTROABS 4.1  --   HGB 14.7 13.9  HCT 40.5 38.6*  MCV 83.7 83.0  PLT 278 240   Cardiac Enzymes:  Recent Labs Lab 06/14/13 2159 06/15/13 0150 06/15/13 0902 06/15/13 1602  TROPONINI <0.30 <0.30 <0.30 <0.30   BNP: BNP (last 3 results)  Recent Labs  06/16/13 0855  PROBNP 44.2   CBG:  Recent Labs Lab 06/15/13 0634 06/15/13  1141  GLUCAP 99 78       Signed:  Galilea Quito K  [facility] 06/16/2013, 10:49 AM

## 2013-06-16 NOTE — Progress Notes (Signed)
Pt reported having double-vision after returning from MRI. Patient's right side facial droop had also returned. Md notified and informed of assessment. No new orders given. RN will continue to monitor.

## 2013-06-16 NOTE — Progress Notes (Signed)
   CARE MANAGEMENT NOTE 06/16/2013  Patient:  Kenneth Andrews   Account Number:  [id number]  Date Initiated:  06/16/2013  Documentation initiated by:  Jiles Crocker  Subjective/Objective Assessment:   ADMITTED WITH CVA     Action/Plan:   PCP: GREEN, Lenon Curt, MD  LIVES AT HOME WITH SPOUSE; CM FOLLOWING FOR DCP; AWAITING FOR PT/OT EVAL FOR DISPOSITION   Anticipated DC Date:  06/23/2013   Anticipated DC Plan:  POSSIBLY HOME W HOME HEALTH SERVICES OR SNF PLACEMENT      DC Planning Services  CM consult          Status of service:  In process, will continue to follow Medicare Important Message given?  NA - LOS <3 / Initial given by admissions (If response is "NO", the following Medicare IM given date fields will be blank)  Per UR Regulation:  Reviewed for med. necessity/level of care/duration of stay  Comments:  06/16/2013- B Analese Sovine RN,BSN,MHA

## 2014-03-06 ENCOUNTER — Encounter: Payer: Self-pay | Admitting: *Deleted

## 2015-03-29 ENCOUNTER — Other Ambulatory Visit: Payer: Self-pay

## 2015-03-29 ENCOUNTER — Emergency Department (HOSPITAL_BASED_OUTPATIENT_CLINIC_OR_DEPARTMENT_OTHER)
Admission: EM | Admit: 2015-03-29 | Discharge: 2015-03-29 | Disposition: A | Discharge status: Home / Self Care | Payer: BLUE CROSS/BLUE SHIELD | Attending: Emergency Medicine | Admitting: Emergency Medicine

## 2015-03-29 ENCOUNTER — Encounter (HOSPITAL_BASED_OUTPATIENT_CLINIC_OR_DEPARTMENT_OTHER): Payer: Self-pay | Admitting: Emergency Medicine

## 2015-03-29 DIAGNOSIS — R55 Syncope and collapse: Secondary | ICD-10-CM | POA: Insufficient documentation

## 2015-03-29 DIAGNOSIS — R51 Headache: Secondary | ICD-10-CM | POA: Diagnosis not present

## 2015-03-29 DIAGNOSIS — I509 Heart failure, unspecified: Secondary | ICD-10-CM | POA: Diagnosis not present

## 2015-03-29 DIAGNOSIS — Z7982 Long term (current) use of aspirin: Secondary | ICD-10-CM | POA: Diagnosis not present

## 2015-03-29 DIAGNOSIS — Z79899 Other long term (current) drug therapy: Secondary | ICD-10-CM | POA: Diagnosis not present

## 2015-03-29 DIAGNOSIS — R42 Dizziness and giddiness: Secondary | ICD-10-CM | POA: Diagnosis not present

## 2015-03-29 HISTORY — DX: Heart failure, unspecified: I50.9

## 2015-03-29 LAB — BASIC METABOLIC PANEL
Anion gap: 6 (ref 5–15)
BUN: 9 mg/dL (ref 6–20)
CO2: 29 mmol/L (ref 22–32)
Calcium: 9.4 mg/dL (ref 8.9–10.3)
Chloride: 104 mmol/L (ref 101–111)
Creatinine, Ser: 0.8 mg/dL (ref 0.61–1.24)
GFR calc Af Amer: >60 mL/min (ref >60)
GFR calc non Af Amer: >60 mL/min (ref >60)
Glucose, Bld: 95 mg/dL (ref 65–99)
Potassium: 4 mmol/L (ref 3.5–5.1)
Sodium: 139 mmol/L (ref 135–145)

## 2015-03-29 LAB — CBC
HCT: 40.3 % (ref 39.0–52.0)
Hemoglobin: 14.3 g/dL (ref 13.0–17.0)
MCH: 29.7 pg (ref 26.0–34.0)
MCHC: 35.5 g/dL (ref 30.0–36.0)
MCV: 83.8 fL (ref 78.0–100.0)
Platelets: 254 10*3/uL (ref 150–400)
RBC: 4.81 MIL/uL (ref 4.22–5.81)
RDW: 12.7 % (ref 11.5–15.5)
WBC: 7 10*3/uL (ref 4.0–10.5)

## 2015-03-29 LAB — TROPONIN I: Troponin I: <0.03 ng/mL (ref <0.031)

## 2015-03-29 MED ORDER — SODIUM CHLORIDE 0.9 % IV BOLUS (SEPSIS)
1000.0000 mL | Freq: Once | INTRAVENOUS | Status: AC
  Administered 2015-03-29: 1000 mL via INTRAVENOUS

## 2015-03-29 NOTE — ED Provider Notes (Signed)
CSN: [id number]     Arrival date & time 03/29/15  [id number] History   First MD Initiated Contact with Patient 03/29/15 1024     Chief Complaint  Patient presents with  . Dizziness     (Consider location/radiation/quality/duration/timing/severity/associated sxs/prior Treatment) Patient is a 44 y.o. male presenting with dizziness.  Dizziness Quality:  Lightheadedness Severity:  Moderate Onset quality:  Sudden Duration: 20 seconds. Timing:  Intermittent Progression:  Partially resolved Chronicity:  New Context comment:  Occured twice yesterday, once while sitting, once while standing. Relieved by:  Being still Worsened by:  Nothing Associated symptoms: headaches (mild "sinus type" headache)   Associated symptoms: no chest pain, no diarrhea, no nausea, no palpitations, no shortness of breath, no syncope, no vomiting and no weakness     Past Medical History  Diagnosis Date  . CHF (congestive heart failure)    Past Surgical History  Procedure Laterality Date  . Wisdom tooth extraction    . Colonoscopy     Family History  Problem Relation Age of Onset  . Cancer Mother   . Dementia Mother   . Alzheimer's disease Mother   . Heart disease Father   . Coronary artery disease Father   . Kidney disease Sister    History  Substance Use Topics  . Smoking status: Never Smoker   . Smokeless tobacco: Not on file  . Alcohol Use: No    Review of Systems  Respiratory: Negative for shortness of breath.   Cardiovascular: Negative for chest pain, palpitations and syncope.  Gastrointestinal: Negative for nausea, vomiting and diarrhea.  Neurological: Positive for dizziness and headaches (mild "sinus type" headache). Negative for weakness.  All other systems reviewed and are negative.     Allergies  Review of patient's allergies indicates no known allergies.  Home Medications   Prior to Admission medications   Medication Sig Start Date End Date Taking? Authorizing Provider  aspirin  EC 81 MG EC tablet Take 1 tablet (81 mg total) by mouth daily. 06/16/13  Yes Hollice EspySendil K Krishnan, MD  fenofibrate (TRICOR) 48 MG tablet Take 1 tablet (48 mg total) by mouth daily. 06/16/13   Hollice EspySendil K Krishnan, MD   BP 103/66 mmHg  Pulse 62  Temp(Src) 97.8 F (36.6 C) (Oral)  Resp 16  Ht 5\' 6"  (1.676 m)  Wt 168 lb (76.204 kg)  BMI 27.13 kg/m2  SpO2 100% Physical Exam  Constitutional: He is oriented to person, place, and time. He appears well-developed and well-nourished. No distress.  HENT:  Head: Normocephalic and atraumatic.  Mouth/Throat: Oropharynx is clear and moist.  Eyes: Conjunctivae are normal. Pupils are equal, round, and reactive to light. No scleral icterus.  Neck: Neck supple.  Cardiovascular: Normal rate, regular rhythm, normal heart sounds and intact distal pulses.   No murmur heard. Pulmonary/Chest: Effort normal and breath sounds normal. No stridor. No respiratory distress. He has no wheezes. He has no rales.  Abdominal: Soft. He exhibits no distension. There is no tenderness.  Musculoskeletal: Normal range of motion. He exhibits no edema.  Neurological: He is alert and oriented to person, place, and time.  Skin: Skin is warm and dry. No rash noted.  Psychiatric: He has a normal mood and affect. His behavior is normal.  Nursing note and vitals reviewed.   ED Course  Procedures (including critical care time) Labs Review Labs Reviewed  CBC  BASIC METABOLIC PANEL  TROPONIN I    Imaging Review No results found.   EKG Interpretation  Date/Time:  Tuesday Mar 29 2015 10:06:04 EDT Ventricular Rate:  70 PR Interval:  120 QRS Duration: 78 QT Interval:  356 QTC Calculation: 384 R Axis:   51 Text Interpretation:  Sinus rhythm with marked sinus arrhythmia Otherwise  normal ECG No significant change was found Confirmed by [facility] Health[facility]WOFFORD  MD, TREY  ([id number]) on 03/29/2015 11:18:39 AM      MDM   Final diagnoses:  Dizziness  Near syncope    Well appearing 44 yo male  with two episodes of dizziness yesterday.  Today he has had milder but more constant dizziness.  Both yesterday and today are described as lightheaded, not off balance or room spinning.  No syncope.  No palpitations.  Has had some mild left neck pain and headache.  No chest pain.  Screening labs unremarkable.  PE reassuring.  Plan dc home with PCP follow up.      Blake DivineJohn Damarion Mendizabal, MD 03/30/15 [phone]

## 2015-03-29 NOTE — ED Notes (Signed)
44 yo with Dizziness and Discomfort in the left neck and arm since Thursday. Denies passing out but reports having to stop himself in the middle of task to catch himself. Denies N/V/D. Hx of CHF. Pt is alert and oriented.

## 2015-03-29 NOTE — ED Notes (Signed)
Reports BP being high last (138/80) night and taking medication last night for the first time in a long time. Denies being dx with Hypertension. Took Lisinopril.

## 2015-03-29 NOTE — Discharge Instructions (Signed)
Dizziness °Dizziness is a common problem. It is a feeling of unsteadiness or light-headedness. You may feel like you are about to faint. Dizziness can lead to injury if you stumble or fall. A person of any age group can suffer from dizziness, but dizziness is more common in older adults. °CAUSES  °Dizziness can be caused by many different things, including: °· Middle ear problems. °· Standing for too long. °· Infections. °· An allergic reaction. °· Aging. °· An emotional response to something, such as the sight of blood. °· Side effects of medicines. °· Tiredness. °· Problems with circulation or blood pressure. °· Excessive use of alcohol or medicines, or illegal drug use. °· Breathing too fast (hyperventilation). °· An irregular heart rhythm (arrhythmia). °· A low red blood cell count (anemia). °· Pregnancy. °· Vomiting, diarrhea, fever, or other illnesses that cause body fluid loss (dehydration). °· Diseases or conditions such as Parkinson's disease, high blood pressure (hypertension), diabetes, and thyroid problems. °· Exposure to extreme heat. °DIAGNOSIS  °Your health care provider will ask about your symptoms, perform a physical exam, and perform an electrocardiogram (ECG) to record the electrical activity of your heart. Your health care provider may also perform other heart or blood tests to determine the cause of your dizziness. These may include: °· Transthoracic echocardiogram (TTE). During echocardiography, sound waves are used to evaluate how blood flows through your heart. °· Transesophageal echocardiogram (TEE). °· Cardiac monitoring. This allows your health care provider to monitor your heart rate and rhythm in real time. °· Holter monitor. This is a portable device that records your heartbeat and can help diagnose heart arrhythmias. It allows your health care provider to track your heart activity for several days if needed. °· Stress tests by exercise or by giving medicine that makes the heart beat  faster. °TREATMENT  °Treatment of dizziness depends on the cause of your symptoms and can vary greatly. °HOME CARE INSTRUCTIONS  °· Drink enough fluids to keep your urine clear or pale yellow. This is especially important in very hot weather. In older adults, it is also important in cold weather. °· Take your medicine exactly as directed if your dizziness is caused by medicines. When taking blood pressure medicines, it is especially important to get up slowly. °· Rise slowly from chairs and steady yourself until you feel okay. °· In the morning, first sit up on the side of the bed. When you feel okay, stand slowly while holding onto something until you know your balance is fine. °· Move your legs often if you need to stand in one place for a long time. Tighten and relax your muscles in your legs while standing. °· Have someone stay with you for 1-2 days if dizziness continues to be a problem. Do this until you feel you are well enough to stay alone. Have the person call your health care provider if he or she notices changes in you that are concerning. °· Do not drive or use heavy machinery if you feel dizzy. °· Do not drink alcohol. °SEEK IMMEDIATE MEDICAL CARE IF:  °· Your dizziness or light-headedness gets worse. °· You feel nauseous or vomit. °· You have problems talking, walking, or using your arms, hands, or legs. °· You feel weak. °· You are not thinking clearly or you have trouble forming sentences. It may take a friend or family member to notice this. °· You have chest pain, abdominal pain, shortness of breath, or sweating. °· Your vision changes. °· You notice   any bleeding. °· You have side effects from medicine that seems to be getting worse rather than better. °MAKE SURE YOU:  °· Understand these instructions. °· Will watch your condition. °· Will get help right away if you are not doing well or get worse. °Document Released: 04/24/2001 Document Revised: 11/03/2013 Document Reviewed: 05/18/2011 °ExitCare®  Patient Information ©2015 [employer]. This information is not intended to replace advice given to you by your health care provider. Make sure you discuss any questions you have with your health care provider. °Near-Syncope °Near-syncope (commonly known as near fainting) is sudden weakness, dizziness, or feeling like you might pass out. During an episode of near-syncope, you may also develop pale skin, have tunnel vision, or feel sick to your stomach (nauseous). Near-syncope may occur when getting up after sitting or while standing for a long time. It is caused by a sudden decrease in blood flow to the brain. This decrease can result from various causes or triggers, most of which are not serious. However, because near-syncope can sometimes be a sign of something serious, a medical evaluation is required. The specific cause is often not determined. °HOME CARE INSTRUCTIONS  °Monitor your condition for any changes. The following actions may help to alleviate any discomfort you are experiencing: °· Have someone stay with you until you feel stable. °· Lie down right away and prop your feet up if you start feeling like you might faint. Breathe deeply and steadily. Wait until all the symptoms have passed. Most of these episodes last only a few minutes. You may feel tired for several hours.   °· Drink enough fluids to keep your urine clear or pale yellow.   °· If you are taking blood pressure or heart medicine, get up slowly when seated or lying down. Take several minutes to sit and then stand. This can reduce dizziness. °· Follow up with your health care provider as directed.  °SEEK IMMEDIATE MEDICAL CARE IF:  °· You have a severe headache.   °· You have unusual pain in the chest, abdomen, or back.   °· You are bleeding from the mouth or rectum, or you have black or tarry stool.   °· You have an irregular or very fast heartbeat.   °· You have repeated fainting or have seizure-like jerking during an episode.   °· You  faint when sitting or lying down.   °· You have confusion.   °· You have difficulty walking.   °· You have severe weakness.   °· You have vision problems.   °MAKE SURE YOU:  °· Understand these instructions. °· Will watch your condition. °· Will get help right away if you are not doing well or get worse. °Document Released: 10/29/2005 Document Revised: 11/03/2013 Document Reviewed: 04/03/2013 °ExitCare® Patient Information ©2015 [employer]. This information is not intended to replace advice given to you by your health care provider. Make sure you discuss any questions you have with your health care provider. ° °

## 2016-02-17 ENCOUNTER — Other Ambulatory Visit (HOSPITAL_COMMUNITY): Payer: Self-pay | Admitting: Family Medicine

## 2016-02-28 ENCOUNTER — Other Ambulatory Visit (HOSPITAL_COMMUNITY): Payer: Self-pay | Admitting: Family Medicine

## 2016-02-28 DIAGNOSIS — E785 Hyperlipidemia, unspecified: Secondary | ICD-10-CM

## 2016-02-28 DIAGNOSIS — Z8249 Family history of ischemic heart disease and other diseases of the circulatory system: Secondary | ICD-10-CM

## 2016-03-01 ENCOUNTER — Telehealth: Payer: Self-pay | Admitting: Cardiovascular Disease

## 2016-03-01 NOTE — Telephone Encounter (Signed)
[facility] for [facility] with Dr Duke Salviaandolph on 03/20/16.  [facility] for Dr Leonides Sakeandolph's schedule on 03/20/16. lp

## 2016-03-07 ENCOUNTER — Ambulatory Visit (HOSPITAL_COMMUNITY): Payer: BLUE CROSS/BLUE SHIELD

## 2016-03-19 NOTE — Progress Notes (Signed)
Cardiology Office Note   Date:  03/20/2016   ID:  Kenneth Andrews, DOB Dec 09, 1970, MRN [id number]  PCP:  Kenneth ReeveWOLTERS,SHARON A, MD  Cardiologist:   Kenneth Siiffany Milford, MD   Chief Complaint  Patient presents with  . New Evaluation    Referred by Kenneth GuernseySharon Wolers, MD--Fam Hx of CAD  pt Andrews/o chest soreness/pressure on left rib cage; numbness in left arm; and occasional dizziness       History of Present Illness: Kenneth Andrews is Andrews 45 y.o. male with chronic diastolic heart failure, TIA vs. anxiety and hypertriglyceridemia who presents for an evaluation of family history of premature coronary artery disease.  Kenneth Andrews has been feeling very tired lately.  He notes occasional, dull pain under the L axilla.  The pain Has been occurring intermittently for over 2 years. He sometimes notes associated left arm numbness. He denies shortness of breath or nausea but does endorse diaphoresis.  The discomfort typically occurs at rest and not with exertion.  He denies lower extremity edema, orthopnea, or PND.  The episodes last from seconds to minutes at Andrews time.  Kenneth Andrews does not get much exercise.  The most he exerts himself is playing with his kids.  He denies chest pain with this activity.   His father had Andrews heart attack at age 45 and his paternal grandmother had Andrews heart attack at age 45.   He notes diplopia when he tries to drive first thing in the morning.  He last saw an opthalmologist 3 months ago and reported these symptoms.   Past Medical History  Diagnosis Date  . CHF (congestive heart failure) (HCC)   . Hyperlipidemia   . Diastolic dysfunction 03/20/2016    Grade 1 diastolic dysfunction on echo in 2014.    Past Surgical History  Procedure Laterality Date  . Wisdom tooth extraction    . Colonoscopy       No current outpatient prescriptions on file.   No current facility-administered medications for this visit.    Allergies:   Review of patient's allergies indicates no known  allergies.    Social History:  The patient  reports that he has never smoked. He does not have any smokeless tobacco history on file. He reports that he does not drink alcohol or use illicit drugs.   Family History:  The patient's family history includes Alzheimer's disease in his mother; Cancer in his mother; Coronary artery disease in his father; Dementia in his mother; Heart disease in his father; Kidney disease in his sister.    ROS:  Please see the history of present illness.   Otherwise, review of systems are positive for none.   All other systems are reviewed and negative.    PHYSICAL EXAM: VS:  BP 102/74 mmHg  Pulse 90  Ht 5\' 6"  (1.676 m)  Wt 80.922 kg (178 lb 6.4 oz)  BMI 28.81 kg/m2 , BMI Body mass index is 28.81 kg/(m^2). GENERAL:  Well appearing HEENT:  Pupils equal round and reactive, fundi not visualized, oral mucosa unremarkable NECK:  No jugular venous distention, waveform within normal limits, carotid upstroke brisk and symmetric, no bruits, no thyromegaly LYMPHATICS:  No cervical adenopathy LUNGS:  Clear to auscultation bilaterally HEART:  RRR.  PMI not displaced or sustained,S1 and S2 within normal limits, no S3, no S4, no clicks, no rubs, no murmurs CHEST: Tenderness to palpation of the sternum and over the left chest wall. ABD:  Flat, positive bowel sounds normal in frequency in  pitch, no bruits, no rebound, no guarding, no midline pulsatile mass, no hepatomegaly, no splenomegaly EXT:  2 plus pulses throughout, no edema, no cyanosis no clubbing SKIN:  No rashes no nodules NEURO:  Cranial nerves II through XII grossly intact, motor grossly intact throughout PSYCH:  Cognitively intact, oriented to person place and time    EKG:  EKG is ordered today. The ekg ordered today demonstrateSinus rhythm. Rate 90 bpm.  Recent Labs: 03/29/2015: BUN 9; Creatinine, Ser 0.80; Hemoglobin 14.3; Platelets 254; Potassium 4.0; Sodium 139   10/13/15: AST 12, ALT 13 TSH  0.8 Hemoglobin 15.2 Total cholesterol 195, HDL 37, triglycerides 174, LDL 124  Echo 06/15/13: LVEF 50-55%. Grade 1 diastolic dysfunction.  Lipid Panel    Component Value Date/Time   CHOL 166 06/15/2013 0150   TRIG 940* 06/15/2013 0150   HDL 18* 06/15/2013 0150   CHOLHDL 9.2 06/15/2013 0150   VLDL UNABLE TO CALCULATE IF TRIGLYCERIDE OVER 400 mg/dL 16/08/9603 5409   LDLCALC UNABLE TO CALCULATE IF TRIGLYCERIDE OVER 400 mg/dL 81/19/1478 2956      Wt Readings from Last 3 Encounters:  03/20/16 80.922 kg (178 lb 6.4 oz)  03/29/15 76.204 kg (168 lb)  06/15/13 81.7 kg (180 lb 1.9 oz)      ASSESSMENT AND PLAN:  # Atypical chest pain: Kenneth Andrews's chest pain is atypical and he has discomfort with palpation on exam. However, he has Andrews family history of premature CAD and an unclear history of possible TIA. Therefore, we will refer him for exercise treadmill stress test. If this is negative, we will obtain Andrews coronary calcium score, as this will better help risk stratify him. If this is elevated we will consider aspirin and Andrews statin on him.  His ASCVD 10 year risk is 1.9  # Diastolic dysfunction: Kenneth Andrews was noted to have grade 1 diastolic dysfunction on his echo in 2014. He has no signs or symptoms of heart failure. Blood pressure is well-controlled.   Current medicines are reviewed at length with the patient today.  The patient does not have concerns regarding medicines.  The following changes have been made:  no change  Labs/ tests ordered today include:   Orders Placed This Encounter  Procedures  . Exercise Tolerance Test  . EKG 12-Lead     Disposition:   FU with Kenneth Andrews. Duke Salvia, MD, [facility] in 1 year.    This note was written with the assistance of speech recognition software.  Please excuse any transcriptional errors.  Signed, Kenneth Lofaso Andrews. Duke Salvia, MD, [facility]  03/20/2016 10:06 AM    [facility]

## 2016-03-20 ENCOUNTER — Encounter: Payer: Self-pay | Admitting: Cardiovascular Disease

## 2016-03-20 ENCOUNTER — Ambulatory Visit (INDEPENDENT_AMBULATORY_CARE_PROVIDER_SITE_OTHER): Payer: BLUE CROSS/BLUE SHIELD | Admitting: Cardiovascular Disease

## 2016-03-20 VITALS — BP 102/74 | HR 90 | Ht 66.0 in | Wt 178.4 lb

## 2016-03-20 DIAGNOSIS — R079 Chest pain, unspecified: Secondary | ICD-10-CM | POA: Diagnosis not present

## 2016-03-20 DIAGNOSIS — I519 Heart disease, unspecified: Secondary | ICD-10-CM

## 2016-03-20 DIAGNOSIS — I5189 Other ill-defined heart diseases: Secondary | ICD-10-CM | POA: Insufficient documentation

## 2016-03-20 HISTORY — DX: Other ill-defined heart diseases: I51.89

## 2016-03-20 NOTE — Patient Instructions (Signed)
Medication Instructions:  Your physician recommends that you continue on your current medications as directed. Please refer to the Current Medication list given to you today.  Labwork: NONE  Testing/Procedures: Your physician has requested that you have an exercise tolerance test. For further information please visit [email or web address]. Please also follow instruction sheet, as given.  Follow-Up: Your physician wants you to follow-up in: 1 YEAR OV You will receive a reminder letter in the mail two months in advance. If you don't receive a letter, please call our office to schedule the follow-up appointment.  If you need a refill on your cardiac medications before your next appointment, please call your pharmacy.

## 2016-04-06 ENCOUNTER — Telehealth (HOSPITAL_COMMUNITY): Payer: Self-pay

## 2016-04-06 NOTE — Telephone Encounter (Signed)
Encounter complete. 

## 2016-04-11 ENCOUNTER — Ambulatory Visit (HOSPITAL_COMMUNITY)
Admission: RE | Admit: 2016-04-11 | Discharge: 2016-04-11 | Disposition: A | Discharge status: Home / Self Care | Payer: BLUE CROSS/BLUE SHIELD | Source: Ambulatory Visit | Attending: Cardiovascular Disease | Admitting: Cardiovascular Disease

## 2016-04-11 DIAGNOSIS — R079 Chest pain, unspecified: Secondary | ICD-10-CM | POA: Diagnosis not present

## 2016-04-11 LAB — EXERCISE TOLERANCE TEST
Estimated workload: 10.1 METS
Exercise duration (min): 9 min
MPHR: 175 {beats}/min
Peak HR: 155 {beats}/min
Percent HR: 88 %
RPE: 15
Rest HR: 88 {beats}/min

## 2016-04-18 ENCOUNTER — Telehealth: Payer: Self-pay | Admitting: *Deleted

## 2016-04-18 NOTE — Telephone Encounter (Signed)
Left message to call back  

## 2016-04-18 NOTE — Telephone Encounter (Signed)
-----   Message from Kenneth Andrews [place], MD sent at 04/13/2016  9:09 AM EDT ----- Normal stress test.

## 2016-05-25 NOTE — Telephone Encounter (Signed)
Notes Recorded by Burnell BlanksMelinda B Harutyun Monteverde on 05/09/2016 at 12:26 PM Advised patient of results

## 2016-12-27 ENCOUNTER — Ambulatory Visit
Admission: RE | Admit: 2016-12-27 | Discharge: 2016-12-27 | Disposition: A | Discharge status: Home / Self Care | Payer: BLUE CROSS/BLUE SHIELD | Source: Ambulatory Visit | Attending: Family Medicine | Admitting: Family Medicine

## 2016-12-27 ENCOUNTER — Other Ambulatory Visit: Payer: Self-pay | Admitting: Family Medicine

## 2016-12-27 DIAGNOSIS — R053 Chronic cough: Secondary | ICD-10-CM

## 2016-12-27 DIAGNOSIS — R05 Cough: Secondary | ICD-10-CM

## 2017-01-28 ENCOUNTER — Ambulatory Visit (INDEPENDENT_AMBULATORY_CARE_PROVIDER_SITE_OTHER): Payer: BLUE CROSS/BLUE SHIELD | Admitting: Internal Medicine

## 2017-01-28 ENCOUNTER — Other Ambulatory Visit (INDEPENDENT_AMBULATORY_CARE_PROVIDER_SITE_OTHER): Payer: BLUE CROSS/BLUE SHIELD

## 2017-01-28 ENCOUNTER — Encounter: Payer: Self-pay | Admitting: Internal Medicine

## 2017-01-28 VITALS — BP 124/72 | HR 108 | Ht 66.0 in | Wt 184.0 lb

## 2017-01-28 DIAGNOSIS — R05 Cough: Secondary | ICD-10-CM | POA: Diagnosis not present

## 2017-01-28 DIAGNOSIS — R058 Other specified cough: Secondary | ICD-10-CM | POA: Insufficient documentation

## 2017-01-28 LAB — CBC WITH DIFFERENTIAL/PLATELET
Basophils Absolute: 0.1 10*3/uL (ref 0.0–0.1)
Basophils Relative: 0.9 % (ref 0.0–3.0)
Eosinophils Absolute: 0.1 10*3/uL (ref 0.0–0.7)
Eosinophils Relative: 1.1 % (ref 0.0–5.0)
HCT: 42 % (ref 39.0–52.0)
Hemoglobin: 14.5 g/dL (ref 13.0–17.0)
Lymphocytes Relative: 28.4 % (ref 12.0–46.0)
Lymphs Abs: 2.5 10*3/uL (ref 0.7–4.0)
MCHC: 34.6 g/dL (ref 30.0–36.0)
MCV: 82.1 fl (ref 78.0–100.0)
Monocytes Absolute: 0.5 10*3/uL (ref 0.1–1.0)
Monocytes Relative: 5.3 % (ref 3.0–12.0)
Neutro Abs: 5.7 10*3/uL (ref 1.4–7.7)
Neutrophils Relative %: 64.3 % (ref 43.0–77.0)
Platelets: 300 10*3/uL (ref 150.0–400.0)
RBC: 5.12 Mil/uL (ref 4.22–5.81)
RDW: 13.2 % (ref 11.5–15.5)
WBC: 8.8 10*3/uL (ref 4.0–10.5)

## 2017-01-28 MED ORDER — GABAPENTIN 100 MG PO CAPS: 100.0000 mg | ORAL_CAPSULE | Freq: Three times a day (TID) | ORAL | 2 refills | Status: DC

## 2017-01-28 NOTE — Patient Instructions (Addendum)
Please remember to go to the lab department downstairs in the basement  for your tests - we will call you with the results when they are available.  For drainage / throat tickle as needed  Try  CHLORPHENIRAMINE  4 mg - take one every 4 hours as needed - available over the counter- may cause drowsiness so start with just a bedtime dose or two and see how you tolerate it before trying in daytime     GERD (REFLUX)  is an extremely common cause of respiratory symptoms just like yours , many times with no obvious heartburn at all.    It can be treated with medication, but also with lifestyle changes including elevation of the head of your bed (ideally with 6 inch  bed blocks),  Smoking cessation, avoidance of late meals, excessive alcohol, and avoid fatty foods, chocolate, peppermint, colas, red wine, and acidic juices such as orange juice.  NO MINT OR MENTHOL PRODUCTS SO NO COUGH DROPS   USE SUGARLESS CANDY INSTEAD (Jolley ranchers or Stover's or Life Savers) or even ice chips will also do - the key is to swallow to prevent all throat clearing. NO OIL BASED VITAMINS - use powdered substitutes.   Start Gabapentin 100 mg three times a day until return and the urge to clear your throat should gradually fade  Please schedule a follow up office visit in 4 weeks, sooner if needed

## 2017-01-28 NOTE — Progress Notes (Signed)
Subjective:     Patient ID: Kenneth Andrews, male   DOB: September 20, 1971,    MRN: [id number]  HPI  24 yowf never smoker from [place] with cough ever since he's known his wife  = 2005 referred to pulmonary clinic 01/28/2017 by Dr  Mila Palmer    01/28/2017 1st [facility] office visit/ Kenneth Andrews   Chief Complaint  Patient presents with  . Pulmonary Consult    Referred by Dr. Mila Palmer. Pt c/o cough for the past 11 yrs. He states cough is non prod. He has to clear his throat often.   cough same all year round always preceded by urge to clear his throat and never able and sense of globus persists  Has has some problems with spring runny eyes/nose but much  better as older  Allergy testing in [place] found pos pollen dogs cats but thinks these symptoms have been much better also and otc's don't help the cough      Kouffman Reflux v Neurogenic Cough Differentiator Reflux Comments  Do you awaken from a sound sleep coughing violently?                            With trouble breathing? Rarely    Do you have choking episodes when you cannot  Get enough air, gasping for air ?              Rarely    Do you usually cough when you lie down into  The bed, or when you just lie down to rest ?                          No worse then    Do you usually cough after meals or eating?         no   Do you cough when (or after) you bend over?    no   GERD SCORE     Kouffman Reflux v Neurogenic Cough Differentiator Neurogenic   Do you more-or-less cough all day long? All day   Does change of temperature make you cough? no   Does laughing or chuckling cause you to cough? yes   Do fumes (perfume, automobile fumes, burned  Toast, etc.,) cause you to cough ?      Def can't breathe    Does speaking, singing, or talking on the phone cause you to cough   ?               About the same    Neurogenic/Airway score      No obvious day to day or daytime variability or assoc excess/ purulent sputum or mucus plugs or  hemoptysis or cp or chest tightness, subjective wheeze or overt sinus or hb symptoms. No unusual exp hx or h/o childhood pna/ asthma or knowledge of premature birth.  Also denies any obvious fluctuation of symptoms with weather or environmental changes or other aggravating or alleviating factors except as outlined above   Current Medications, Allergies, Complete Past Medical History, Past Surgical History, Family History, and Social History were reviewed in [employer] record.  ROS  The following are not active complaints unless bolded sore throat, dysphagia, dental problems, itching, sneezing,  nasal congestion or excess/ purulent secretions, ear ache,   fever, chills, sweats, unintended wt loss, classically pleuritic or exertional cp,  orthopnea pnd or leg swelling, presyncope, palpitations, abdominal pain, anorexia, nausea, vomiting,  diarrhea  or change in bowel or bladder habits, change in stools or urine, dysuria,hematuria,  rash, arthralgias, visual complaints, headache, numbness, weakness or ataxia or problems with walking or coordination,  change in mood/affect or memory.          Review of Systems     Objective:   Physical Exam    amb mw nad with classic voice fatigue and freq throat clearing   Wt Readings from Last 3 Encounters:  01/28/17 184 lb (83.5 kg)  03/20/16 178 lb 6.4 oz (80.9 kg)  03/29/15 168 lb (76.2 kg)    Vital signs reviewed  - Note on arrival 02 sats  97% on RA     HEENT: nl dentition,   and oropharynx. Nl external ear canals without cough reflex - moderate bilateral non-specific turbinate edema     NECK :  without JVD/Nodes/TM/ nl carotid upstrokes bilaterally   LUNGS: no acc muscle use,  Nl contour chest which is clear to A and P bilaterally without cough on insp or exp maneuvers   CV:  RRR  no s3 or murmur or increase in P2, and no edema   ABD:  soft and nontender with nl inspiratory excursion in the supine position. No bruits  or organomegaly appreciated, bowel sounds nl  MS:  Nl gait/ ext warm without deformities, calf tenderness, cyanosis or clubbing No obvious joint restrictions   SKIN: warm and dry without lesions    NEURO:  alert, approp, nl sensorium with  no motor or cerebellar deficits apparent.       I personally reviewed images and agree with radiology impression as follows:  CXR:   12/27/16 Negative chest.   Allergy profile 01/28/2017 >  Eos 0. /  IgE     Assessment:

## 2017-01-28 NOTE — Assessment & Plan Note (Addendum)
Allergy profile 01/28/2017 >  Eos 0. /  IgE  Pending  - gabapentin 100 tid 01/28/2017 >>>    The most common causes of chronic cough in immunocompetent adults include the following: upper airway cough syndrome (UACS), previously referred to as postnasal drip syndrome (PNDS), which is caused by variety of rhinosinus conditions; (2) asthma; (3) GERD; (4) chronic bronchitis from cigarette smoking or other inhaled environmental irritants; (5) nonasthmatic eosinophilic bronchitis; and (6) bronchiectasis.   These conditions, singly or in combination, have accounted for up to 94% of the causes of chronic cough in prospective studies.   Other conditions have constituted no >6% of the causes in prospective studies These have included bronchogenic carcinoma, chronic interstitial pneumonia, sarcoidosis, left ventricular failure, ACEI-induced cough, and aspiration from a condition associated with pharyngeal dysfunction.    Chronic cough is often simultaneously caused by more than one condition. A single cause has been found from 38 to 82% of the time, multiple causes from 18 to 62%. Multiply caused cough has been the result of three diseases up to 42% of the time.       Most likely based on chronicity and difficulty with swallowing pills "all his life" this is Upper airway cough syndrome (previously labeled PNDS) , is  so named because it's frequently impossible to sort out how much is  CR/sinusitis with freq throat clearing (which can be related to primary GERD)   vs  causing  secondary (" extra esophageal")  GERD from wide swings in gastric pressure that occur with throat clearing, often  promoting self use of mint and menthol lozenges that reduce the lower esophageal sphincter tone and exacerbate the problem further in a cyclical fashion.   These are the same pts (now being labeled as having "irritable larynx syndrome" by some cough centers) who not infrequently have a history of having failed to tolerate ace  inhibitors,  dry powder inhalers or biphosphonates or report having atypical/extraesophageal reflux symptoms that don't respond to standard doses of PPI  and are easily confused as having aecopd or asthma flares by even experienced allergists/ pulmonologists (myself included).   Rec w/u with allergy profile and trial of gabapeint 100 tid and prn 1st gen H1 and f/u in 1 month   Discussed with pt:  The standardized cough guidelines published in Chest by Stark Fallsichard Irwin in 2006 are still the best available and consist of a multiple step process (up to 12!) , not a single office visit,  and are intended  to address this problem logically,  with an alogrithm dependent on response to empiric treatment at  each progressive step  to determine a specific diagnosis with  minimal addtional testing needed. Therefore if adherence is an issue or can't be accurately verified,  it's very unlikely the standard evaluation and treatment will be successful here.    Furthermore, response to therapy (other than acute cough suppression, which should only be used short term with avoidance of narcotic containing cough syrups if possible), can be a gradual process for which the patient is not likely to  perceive immediate benefit.  Unlike going to an eye doctor where the best perscription is almost always the first one and is immediately effective, this is almost never the case in the management of chronic cough syndromes. Therefore the patient needs to commit up front to consistently adhere to recommendations  for up to 6 weeks of therapy directed at the likely underlying problem(s) before the response can be reasonably evaluated.  Total time devoted to counseling  > 50 % of initial 60 min office visit:  review case with pt/ discussion of options/alternatives/ personally creating written customized instructions  in presence of pt  then going over those specific  Instructions directly with the pt including how to use all of the  meds but in particular covering each new medication in detail and the difference between the maintenance= "automatic" meds and the prns using an action plan format for the latter (If this problem/symptom => do that organization reading Left to right).  Please see AVS from this visit for a full list of these instructions which I personally wrote for this pt and  are unique to this visit.

## 2017-01-29 LAB — RESPIRATORY ALLERGY PROFILE REGION II ~~LOC~~
Allergen, A. alternata, m6: <0.1 kU/L
Allergen, C. Herbarum, M2: <0.1 kU/L
Allergen, Cedar tree, t12: <0.1 kU/L
Allergen, Comm Silver Birch, t9: <0.1 kU/L
Allergen, Cottonwood, t14: <0.1 kU/L
Allergen, D pternoyssinus,d7: <0.1 kU/L
Allergen, Mouse Urine Protein, e78: <0.1 kU/L
Allergen, Mulberry, t76: <0.1 kU/L
Allergen, Oak,t7: <0.1 kU/L
Allergen, P. notatum, m1: <0.1 kU/L
Aspergillus fumigatus, m3: <0.1 kU/L
Bermuda Grass: <0.1 kU/L
Box Elder IgE: <0.1 kU/L
Cat Dander: <0.1 kU/L
Cockroach: <0.1 kU/L
Common Ragweed: 3.44 kU/L — ABNORMAL HIGH
D. farinae: <0.1 kU/L
Dog Dander: <0.1 kU/L
Elm IgE: <0.1 kU/L
IgE (Immunoglobulin E), Serum: 19 kU/L (ref <115)
Johnson Grass: <0.1 kU/L
Pecan/Hickory Tree IgE: <0.1 kU/L
Rough Pigweed  IgE: <0.1 kU/L
Sheep Sorrel IgE: <0.1 kU/L
Timothy Grass: <0.1 kU/L

## 2017-01-29 NOTE — Progress Notes (Signed)
Spoke with pt and notified of results per Dr. Wert. Pt verbalized understanding and denied any questions. 

## 2017-02-28 ENCOUNTER — Ambulatory Visit: Payer: BLUE CROSS/BLUE SHIELD | Admitting: Internal Medicine

## 2017-04-22 ENCOUNTER — Ambulatory Visit
Admission: RE | Admit: 2017-04-22 | Discharge: 2017-04-22 | Disposition: A | Discharge status: Home / Self Care | Payer: BLUE CROSS/BLUE SHIELD | Source: Ambulatory Visit | Attending: Orthopedic Surgery | Admitting: Orthopedic Surgery

## 2017-04-22 ENCOUNTER — Other Ambulatory Visit: Payer: Self-pay | Admitting: Orthopedic Surgery

## 2017-04-22 DIAGNOSIS — M47816 Spondylosis without myelopathy or radiculopathy, lumbar region: Secondary | ICD-10-CM

## 2017-06-13 ENCOUNTER — Emergency Department (HOSPITAL_COMMUNITY): Payer: BLUE CROSS/BLUE SHIELD

## 2017-06-13 ENCOUNTER — Encounter (HOSPITAL_COMMUNITY): Payer: Self-pay

## 2017-06-13 ENCOUNTER — Emergency Department (HOSPITAL_COMMUNITY)
Admission: EM | Admit: 2017-06-13 | Discharge: 2017-06-13 | Disposition: A | Discharge status: Home / Self Care | Payer: BLUE CROSS/BLUE SHIELD | Attending: Emergency Medicine | Admitting: Emergency Medicine

## 2017-06-13 DIAGNOSIS — Z79899 Other long term (current) drug therapy: Secondary | ICD-10-CM | POA: Insufficient documentation

## 2017-06-13 DIAGNOSIS — R29810 Facial weakness: Secondary | ICD-10-CM | POA: Diagnosis not present

## 2017-06-13 DIAGNOSIS — R079 Chest pain, unspecified: Secondary | ICD-10-CM | POA: Diagnosis present

## 2017-06-13 DIAGNOSIS — Z7982 Long term (current) use of aspirin: Secondary | ICD-10-CM | POA: Insufficient documentation

## 2017-06-13 DIAGNOSIS — Z8673 Personal history of transient ischemic attack (TIA), and cerebral infarction without residual deficits: Secondary | ICD-10-CM | POA: Diagnosis not present

## 2017-06-13 DIAGNOSIS — I5032 Chronic diastolic (congestive) heart failure: Secondary | ICD-10-CM | POA: Diagnosis not present

## 2017-06-13 DIAGNOSIS — R6884 Jaw pain: Secondary | ICD-10-CM | POA: Insufficient documentation

## 2017-06-13 DIAGNOSIS — R110 Nausea: Secondary | ICD-10-CM | POA: Insufficient documentation

## 2017-06-13 DIAGNOSIS — R410 Disorientation, unspecified: Secondary | ICD-10-CM | POA: Diagnosis not present

## 2017-06-13 LAB — BASIC METABOLIC PANEL
Anion gap: 8 (ref 5–15)
BUN: 8 mg/dL (ref 6–20)
CO2: 25 mmol/L (ref 22–32)
Calcium: 9.4 mg/dL (ref 8.9–10.3)
Chloride: 106 mmol/L (ref 101–111)
Creatinine, Ser: 0.83 mg/dL (ref 0.61–1.24)
GFR calc Af Amer: >60 mL/min (ref >60)
GFR calc non Af Amer: >60 mL/min (ref >60)
Glucose, Bld: 116 mg/dL — ABNORMAL HIGH (ref 65–99)
Potassium: 4 mmol/L (ref 3.5–5.1)
Sodium: 139 mmol/L (ref 135–145)

## 2017-06-13 LAB — I-STAT TROPONIN, ED
Troponin i, poc: 0 ng/mL (ref 0.00–0.08)
Troponin i, poc: 0 ng/mL (ref 0.00–0.08)

## 2017-06-13 LAB — CBC
HCT: 39.9 % (ref 39.0–52.0)
Hemoglobin: 13.9 g/dL (ref 13.0–17.0)
MCH: 28.5 pg (ref 26.0–34.0)
MCHC: 34.8 g/dL (ref 30.0–36.0)
MCV: 81.8 fL (ref 78.0–100.0)
Platelets: 250 10*3/uL (ref 150–400)
RBC: 4.88 MIL/uL (ref 4.22–5.81)
RDW: 13 % (ref 11.5–15.5)
WBC: 7 10*3/uL (ref 4.0–10.5)

## 2017-06-13 MED ORDER — MORPHINE SULFATE (PF) 4 MG/ML IV SOLN: 4.0000 mg | Freq: Once | INTRAVENOUS | Status: DC

## 2017-06-13 NOTE — Discharge Instructions (Signed)
You can take Tylenol or Ibuprofen as directed for pain.  Follow-up with the referred cardiologist. Call their office and arrange for an appointment.   Follow-up with the referred neurologist as needed.   Follow-up with your primary care doctor in 24-48 hours for further evaluation.   Return the emergency Department for any worsening chest pain, difficulty breathing, nausea, sweating, numbness/weakness of her arms or legs, facial droop, difficulty speaking, difficulty walking, any other worsening or concerning symptoms.

## 2017-06-13 NOTE — ED Notes (Signed)
Dr Denton Lanksteinl aware of pts symptoms

## 2017-06-13 NOTE — ED Provider Notes (Signed)
[facility]-EMERGENCY DEPT Provider Note   CSN: [id number] Arrival date & time: 06/13/17  0903     History   Chief Complaint Chief Complaint  Patient presents with  . Chest Pain    HPI Kenneth Andrews is a 46 y.o. male with PMH/o Hyperlipidemia who presents with left sided chest pain that began at 0700 this AM. Patient states that his chest pain is a "constistent dull ache on the left side of his chest." He also notes that he has been having some pain in his left jaw. He took 7781 ASA this morning when pain began but no other medication. He reports that he's had some nausea but denies any diaphoresis. The chest pain is not worsened with exertion. He denies any other alleviating or aggravating factors. Wife also notes that this morning she noticed a right sided facial droop. Patient denies any other neuro complaints. Patient had a similar episode in 2014 where he complained of the same symptoms. He was sent to [facility] ED for a code stroke evaluation. He was admitted for a stroke workup was unremarkable. He was found to have high triglycerides at that time but they since have normalized without the use medication. Patient had a stress test that was unremarkable done immediately after that admission. He does not currently follow with the cardiologist. He denies any personal cardiac history. He does note that his father had a heart attack at age 46. Patient is not a smoker. Patient denies any recent fevers, headaches, shortness of breath, abdominal pain, vomiting, dysuria, hematuria, numbness/weakness of his arms or legs, difficulty in relating, slurred speech.  The history is provided by the patient.    Past Medical History:  Diagnosis Date  . CHF (congestive heart failure) (HCC)   . Diastolic dysfunction 03/20/2016   Grade 1 diastolic dysfunction on echo in 2014.  Marland Kitchen. Hyperlipidemia     Patient Active Problem List   Diagnosis Date Noted  . Upper airway cough syndrome 01/28/2017  . Diastolic dysfunction  03/20/2016  . Chronic diastolic congestive heart failure (HCC) 06/16/2013  . TIA (transient ischemic attack) 06/15/2013  . Chest pain 06/15/2013  . Hypertriglyceridemia 06/15/2013    Past Surgical History:  Procedure Laterality Date  . COLONOSCOPY    . WISDOM TOOTH EXTRACTION         Home Medications    Prior to Admission medications   Medication Sig Start Date End Date Taking? Authorizing Provider  aspirin 81 MG chewable tablet Chew 81 mg by mouth daily.    [provider]  gabapentin (NEURONTIN) 100 MG capsule Take 1 capsule (100 mg total) by mouth 3 (three) times daily. One three times daily 01/28/17   Nyoka CowdenWert, Michael B, MD  Multiple Vitamins-Minerals (MEMORY VITE) TABS Take 1 tablet by mouth daily.    [provider]  Multiple Vitamins-Minerals (MULTIVITAMIN WITH MINERALS) tablet Take 1 tablet by mouth daily.    [provider]  Omega-3 Fatty Acids (FISH OIL PO) Take 1 capsule by mouth daily.    [provider]    Family History Family History  Problem Relation Age of Onset  . Cancer Mother   . Dementia Mother   . Alzheimer's disease Mother   . Heart disease Father   . Coronary artery disease Father   . Kidney disease Sister   . Pancreatic cancer Maternal Grandmother     Social History Social History  Substance Use Topics  . Smoking status: Never Smoker  . Smokeless tobacco: Never Used  .  Alcohol use No     Allergies   Patient has no known allergies.   Review of Systems Review of Systems  Constitutional: Negative for chills and fever.  Eyes: Negative for visual disturbance.  Respiratory: Negative for cough and shortness of breath.   Cardiovascular: Positive for chest pain.  Gastrointestinal: Positive for nausea. Negative for abdominal pain and vomiting.  Genitourinary: Negative for dysuria and hematuria.  Musculoskeletal: Negative for neck pain.  Neurological: Positive for facial asymmetry. Negative for dizziness,  speech difficulty, weakness, numbness and headaches.  Psychiatric/Behavioral: Positive for confusion.  All other systems reviewed and are negative.    Physical Exam Updated Vital Signs BP 123/79   Pulse 72   Temp 98.6 F (37 C) (Oral)   Resp 18   Ht 5\' 6"  (1.676 m)   Wt 79.4 kg (175 lb)   SpO2 98%   BMI 28.25 kg/m   Physical Exam  Constitutional: He is oriented to person, place, and time. He appears well-developed and well-nourished.  Sitting comfortably on examination table  HENT:  Head: Normocephalic and atraumatic.  Mouth/Throat: Oropharynx is clear and moist and mucous membranes are normal.  Eyes: Pupils are equal, round, and reactive to light. Conjunctivae, EOM and lids are normal.  Neck: Full passive range of motion without pain.  Cardiovascular: Normal rate, regular rhythm, normal heart sounds and normal pulses.  Exam reveals no gallop and no friction rub.   No murmur heard. Pulmonary/Chest: Effort normal and breath sounds normal.  Abdominal: Soft. Normal appearance. There is no tenderness. There is no rigidity and no guarding.  Musculoskeletal: Normal range of motion.  Neurological: He is alert and oriented to person, place, and time.  Cranial nerves III-XII intact Follows commands, Moves all extremities  5/5 strength to BUE and BLE  Sensation intact throughout  Normal finger to nose. No dysdiadochokinesia. No pronator drift. No slurred speech.  Mild right sided facial asymmetry when specifically evaluating. When patient is talking normally or is distracted he has no evidence of facial droop and smiles normally.  Skin: Skin is warm and dry. Capillary refill takes less than 2 seconds.  Psychiatric: He has a normal mood and affect. His speech is normal.  Nursing note and vitals reviewed.    ED Treatments / Results  Labs (all labs ordered are listed, but only abnormal results are displayed) Labs Reviewed  BASIC METABOLIC PANEL - Abnormal; Notable for the  following:       Result Value   Glucose, Bld 116 (*)    All other components within normal limits  CBC  I-STAT TROPONIN, ED  I-STAT TROPONIN, ED    EKG  EKG Interpretation  Date/Time:  Thursday June 13 2017 09:09:45 EDT Ventricular Rate:  88 PR Interval:  124 QRS Duration: 88 QT Interval:  336 QTC Calculation: 406 R Axis:   56 Text Interpretation:  Normal sinus rhythm Normal ECG Confirmed by Cathren LaineSteinl, Kevin ([id number]) on 06/13/2017 9:31:44 AM       Radiology Dg Chest 2 View  Result Date: 06/13/2017 CLINICAL DATA:  Chest pain EXAM: CHEST  2 VIEW COMPARISON:  12/27/2016 FINDINGS: Normal heart size and mediastinal contours. No acute infiltrate or edema. No effusion or pneumothorax. No acute osseous findings. IMPRESSION: Negative chest. Electronically Signed   By: Marnee SpringJonathon  Watts M.D.   On: 06/13/2017 09:37   Ct Head Wo Contrast  Result Date: 06/13/2017 CLINICAL DATA:  Left facial droop beginning yesterday. EXAM: CT HEAD WITHOUT CONTRAST TECHNIQUE: Contiguous axial images were obtained  from the base of the skull through the vertex without intravenous contrast. COMPARISON:  MRI 06/15/2013 FINDINGS: Brain: Mild generalized volume loss. No evidence of old or acute focal infarction, mass lesion, hemorrhage, hydrocephalus or extra-axial collection. Dilated perivascular space at the base of the brain on the left as shown on previous MRI studies. Vascular: No abnormal vascular finding. Skull: Negative Sinuses/Orbits: Clear/normal. Insignificant small sinus retention cysts. Other: None IMPRESSION: No acute finding by CT.  Mild generalized volume loss. Electronically Signed   By: Paulina Fusi M.D.   On: 06/13/2017 10:49    Procedures Procedures (including critical care time)  Medications Ordered in ED Medications  morphine 4 MG/ML injection 4 mg (4 mg Intravenous Refused 06/13/17 1049)     Initial Impression / Assessment and Plan / ED Course  I have reviewed the triage vital signs and the  nursing notes.  Pertinent labs & imaging results that were available during my care of the patient were reviewed by me and considered in my medical decision making (see chart for details).     Residual male who presents with left sided chest pain that began this morning. Wife also noted history. No other focal deficits. Patient is afebrile, non-toxic appearing, sitting comfortably on examination table. Vital signs reviewed and stable. Consider ACS vs musculoskeletal pain vs anxiety. Exam shows mild asymmetry when specifically evaluating but he exhibits no other facial drooping when talking normally. Throughout our interview, he talks consistent with no facial droop and smiles normally. Suspect that there may be a component of anxiety related to symptoms.  Low suspicion for CVA or TIA.  Initial labs and imaging ordered at triage including BMP, CBC, troponin, EKG, chest x-ray. Will find out on CT head for evaluation. Analgesics provided in the department. Discussed patient with Dr. Denton Lank.  Labs and imaging reviewed. I-STAT troponin is negative. BMP shows hyperglycemia but otherwise no other abnormality. CBC is unremarkable. EKG was normal sinus rhythm rate, 88. Chest x-ray is unremarkable.  Based on patient presentation, he has a heart score of 3.  RN reported to me that patient refused pain medication.  CT head reviewed. Negative for any acute abnormalities. Low suspicion for CVA given exam findings and normal head CT. Patient only has mild mouth asymmetry when smiling during specific evaluation of that area. During this entire interview and when talking patient has no evidence of facial droop. Given that patient's heart score is 3 and he is at low risk, plan to repeat the troponin. If his troponin is negative, could potentially have patient discharged with outpatient follow-up with neurology and cardiology. The patient on plan he is in agreement. Offered patient additional analgesics, such as Toradol,  that would not be sedative. Patient refuses any pain medications.  Second troponin is negative. Discussed results with patient. He is still not want any pain medication. He reports that his pain has mostly resolved at this point. Patient still with no evidence of facial droop. He is able to talk without any abnormalities or any difficulties. Patient stable for discharge at this time. Will plan to give outpatient referral for cardiology and neurology. Instructed patient to follow-up with them. Instructed patient to follow-up with PCP in 2 days. Strict eturn precautions discussed. Patient expresses understanding and agreement to plan.    Final Clinical Impressions(s) / ED Diagnoses   Final diagnoses:  Chest pain, unspecified type    New Prescriptions New Prescriptions   No medications on file     Maxwell Caul, PA-C  06/13/17 1317    Cathren Laine, MD 06/13/17 1408

## 2017-06-13 NOTE — ED Notes (Signed)
ED Provider at bedside. 

## 2017-06-13 NOTE — ED Triage Notes (Signed)
Per Pt, Pt is coming from home with complaints of Left mid-center dull chest pain along with left jaw pain that started yesterday. Pt reports some nausea, lightheadedness. Pt also reports some left sided facial droop that was noted by his wife yesterday. Pt has no other neuro deficits noted.

## 2017-06-13 NOTE — ED Notes (Signed)
This RN attmepted IV access twice without success. Another RN to try

## 2017-06-13 NOTE — ED Notes (Signed)
Pt returned from CT, nad at this time

## 2017-06-13 NOTE — ED Notes (Signed)
Pt refused pain medicine and IV, unless IV is needed for another reason.

## 2017-06-14 ENCOUNTER — Ambulatory Visit (INDEPENDENT_AMBULATORY_CARE_PROVIDER_SITE_OTHER): Payer: BLUE CROSS/BLUE SHIELD | Admitting: Physician Assistant

## 2017-06-14 VITALS — BP 116/80 | HR 80 | Ht 66.0 in | Wt 186.0 lb

## 2017-06-14 DIAGNOSIS — I5032 Chronic diastolic (congestive) heart failure: Secondary | ICD-10-CM

## 2017-06-14 DIAGNOSIS — R079 Chest pain, unspecified: Secondary | ICD-10-CM

## 2017-06-14 DIAGNOSIS — E781 Pure hyperglyceridemia: Secondary | ICD-10-CM | POA: Diagnosis not present

## 2017-06-14 DIAGNOSIS — G459 Transient cerebral ischemic attack, unspecified: Secondary | ICD-10-CM

## 2017-06-14 LAB — LIPID PANEL
Chol/HDL Ratio: 5.6 ratio — ABNORMAL HIGH (ref 0.0–5.0)
Cholesterol, Total: 169 mg/dL (ref 100–199)
HDL: 30 mg/dL — ABNORMAL LOW (ref >39)
LDL Calculated: 93 mg/dL (ref 0–99)
Triglycerides: 229 mg/dL — ABNORMAL HIGH (ref 0–149)
VLDL Cholesterol Cal: 46 mg/dL — ABNORMAL HIGH (ref 5–40)

## 2017-06-14 NOTE — Progress Notes (Signed)
Cardiology Office Note    Date:  06/15/2017   ID:  Kenneth Andrews, DOB 07-Nov-1971, MRN [id number]  PCP:  Mila Palmer, MD  Cardiologist:  Dr. Duke Salvia  Chief Complaint  Patient presents with  . Follow-up    F/U after ER visit.  . Chest Pain    seen for Dr. Duke Salvia  . Headache    History of Present Illness:  Kenneth Andrews is a 46 y.o. male with PMH of chronic diastolic HF, TIA vs anxiety, and hypertriglyceridemia.  He had a home sleep study in December 2016 that was negative for obstructive sleep apnea. He was previously seen by Dr. Duke Salvia on 03/20/2016 for evaluation of left chest pain. He has early family history of CAD. He had a negative GXT on 04/11/2016. Recently, he went to the ED yesterday for chest pain, there was positive chest wall tenderness. He was also seen for possible stroke as well. CT of the head was negative for acute etiology. Troponin negative 2.  Patient presents today for cardiology office visit. He had several hours of chest pain yesterday before the ED visit. Troponin was negative 2. He also woke up this morning with chest pain and has been having chest pain for the past hour and half. It is very mild over the left breast, it is slightly tender on palpation at one focal point even though the chest discomfort covers a larger area, however he also mentions the frequency of the chest pain is getting worse. I will rule it out with a treadmill Myoview since he already had a GXT last year. His wife show me a picture of the facial drooping that prompted them to go to the ED yesterday, it was quite impressive right facial drooping. He has a neurology follow-up next Tuesday.   Past Medical History:  Diagnosis Date  . CHF (congestive heart failure) (HCC)   . Diastolic dysfunction 03/20/2016   Grade 1 diastolic dysfunction on echo in 2014.  Marland Kitchen Hyperlipidemia     Past Surgical History:  Procedure Laterality Date  . COLONOSCOPY    . WISDOM TOOTH EXTRACTION       Current Medications: Outpatient Medications Prior to Visit  Medication Sig Dispense Refill  . aspirin 81 MG chewable tablet Chew 81 mg by mouth daily.    Marland Kitchen gabapentin (NEURONTIN) 100 MG capsule Take 1 capsule (100 mg total) by mouth 3 (three) times daily. One three times daily 90 capsule 2  . Multiple Vitamins-Minerals (MEMORY VITE) TABS Take 1 tablet by mouth daily.    . Multiple Vitamins-Minerals (MULTIVITAMIN WITH MINERALS) tablet Take 1 tablet by mouth daily.    . Omega-3 Fatty Acids (FISH OIL PO) Take 1 capsule by mouth daily.     No facility-administered medications prior to visit.      Allergies:   Patient has no known allergies.   Social History   Social History  . Marital status: Married    Spouse name: N/A  . Number of children: N/A  . Years of education: N/A   Social History Main Topics  . Smoking status: Never Smoker  . Smokeless tobacco: Never Used  . Alcohol use No  . Drug use: No  . Sexual activity: Not Asked   Other Topics Concern  . None   Social History Narrative  . None     Family History:  The patient's family history includes Alzheimer's disease in his mother; Cancer in his mother; Coronary artery disease in his father; Dementia in his mother;  Heart disease in his father; Kidney disease in his sister; Pancreatic cancer in his maternal grandmother.   ROS:   Please see the history of present illness.    ROS All other systems reviewed and are negative.   PHYSICAL EXAM:   VS:  BP 116/80   Pulse 80   Ht 5\' 6"  (1.676 m)   Wt 186 lb (84.4 kg)   BMI 30.02 kg/m    GEN: Well nourished, well developed, in no acute distress  HEENT: normal  Neck: no JVD, carotid bruits, or masses Cardiac: RRR; no murmurs, rubs, or gallops,no edema  Respiratory:  clear to auscultation bilaterally, normal work of breathing GI: soft, nontender, nondistended, + BS MS: no deformity or atrophy  Skin: warm and dry, no rash Neuro:  Alert and Oriented x 3, Strength and  sensation are intact Psych: euthymic mood, full affect  Wt Readings from Last 3 Encounters:  06/14/17 186 lb (84.4 kg)  06/13/17 175 lb (79.4 kg)  01/28/17 184 lb (83.5 kg)      Studies/Labs Reviewed:   EKG:  EKG is not ordered today.    Recent Labs: 06/13/2017: BUN 8; Creatinine, Ser 0.83; Hemoglobin 13.9; Platelets 250; Potassium 4.0; Sodium 139   Lipid Panel    Component Value Date/Time   CHOL 169 06/14/2017 1001   TRIG 229 (H) 06/14/2017 1001   HDL 30 (L) 06/14/2017 1001   CHOLHDL 5.6 (H) 06/14/2017 1001   CHOLHDL 9.2 06/15/2013 0150   VLDL UNABLE TO CALCULATE IF TRIGLYCERIDE OVER 400 mg/dL 16/10/960408/02/2013 [id number]   LDLCALC 93 06/14/2017 1001    Additional studies/ records that were reviewed today include:   GXT 04/11/2016 Study Highlights    There was no ST segment deviation noted during stress.  Normal study with no ischemia.  Normal exericse capacity.  Normal BP and heart rate response to exercise.  This is a low risk study.    CT of head 06/13/2017 IMPRESSION: No acute finding by CT.  Mild generalized volume loss.    ASSESSMENT:    1. Chest pain, unspecified type   2. Hypertriglyceridemia   3. Chronic diastolic heart failure (HCC)   4. Transient cerebral ischemia, unspecified type      PLAN:  In order of problems listed above:  1. Left-sided chest pain: Somewhat atypical, does have a focal area of tenderness. However the the symptom is getting more frequent. Wife also mentions he has been more short of breath recently. We'll rule out coronary artery disease with treadmill Myoview since he had GXT a year ago.  2. Hypertriglyceridemia: He is not on a statin, we'll obtain fasting lipid panel today  3. Possible TIA: His wife has a picture with facial drooping, it lasted a total of a hour before resolving. His wife also mentions, he had a similar episode 4 years ago. I will defer to neurology regarding further workup. If suspicion is he indeed had an embolic  event, that we can consider obtaining echocardiogram in the future.  4. Chronic diastolic heart failure: Euvolemic on physical exam    Medication Adjustments/Labs and Tests Ordered: Current medicines are reviewed at length with the patient today.  Concerns regarding medicines are outlined above.  Medication changes, Labs and Tests ordered today are listed in the Patient Instructions below. Patient Instructions  Medication Instructions:   No changes  Labwork:   Fasting lipid panel today.  Testing/Procedures:  Your physician has requested that you have an exercise stress myoview. For further information please visit  [email or web address] Please follow instruction sheet, as given.   Follow-Up:  3-4 months with Dr. Duke Salviaandolph  If you need a refill on your cardiac medications before your next appointment, please call your pharmacy.      Ramond DialSigned, Galina Haddox, [place]  06/15/2017 9:14 PM    [facility] [address], [place], [place]  [address] Phone: [phone]; Fax: [phone]

## 2017-06-14 NOTE — Patient Instructions (Addendum)
Medication Instructions:   No changes  Labwork:   Fasting lipid panel today.  Testing/Procedures:  Your physician has requested that you have an exercise stress myoview. For further information please visit [email or web address] Please follow instruction sheet, as given.   Follow-Up:  3-4 months with Dr. Duke Salviaandolph  If you need a refill on your cardiac medications before your next appointment, please call your pharmacy.

## 2017-06-15 ENCOUNTER — Encounter: Payer: Self-pay | Admitting: Physician Assistant

## 2017-06-18 ENCOUNTER — Ambulatory Visit (INDEPENDENT_AMBULATORY_CARE_PROVIDER_SITE_OTHER): Payer: BLUE CROSS/BLUE SHIELD | Admitting: Neurology

## 2017-06-18 ENCOUNTER — Telehealth (HOSPITAL_COMMUNITY): Payer: Self-pay

## 2017-06-18 ENCOUNTER — Encounter: Payer: Self-pay | Admitting: Neurology

## 2017-06-18 VITALS — BP 103/70 | HR 86 | Ht 66.0 in | Wt 181.6 lb

## 2017-06-18 DIAGNOSIS — I671 Cerebral aneurysm, nonruptured: Secondary | ICD-10-CM

## 2017-06-18 DIAGNOSIS — R29810 Facial weakness: Secondary | ICD-10-CM

## 2017-06-18 DIAGNOSIS — R51 Headache with orthostatic component, not elsewhere classified: Secondary | ICD-10-CM

## 2017-06-18 DIAGNOSIS — R29898 Other symptoms and signs involving the musculoskeletal system: Secondary | ICD-10-CM | POA: Diagnosis not present

## 2017-06-18 DIAGNOSIS — H532 Diplopia: Secondary | ICD-10-CM

## 2017-06-18 DIAGNOSIS — Q283 Other malformations of cerebral vessels: Secondary | ICD-10-CM

## 2017-06-18 NOTE — Progress Notes (Addendum)
[facility]    Provider:  Dr Lucia Gaskins Referring Provider: Mila Palmer, MD Primary Care Physician:  Mila Palmer, MD  CC:  Facial droop  Addendum 8/29: Imaging of the blood vessels was normal. 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   HPI:  Kenneth Andrews is a 46 y.o. male here as a referral from Dr. Paulino Rily for facial droop. He is here with his wife who provides much information.  Patient had a facial droop, difficulty with cognition,  CP and a mild headache. Patient was taken to the emergency room where imaging and stroke workup were negative.  Patient was seen earlier this month complaining of chest pain and jaw pain, nausea, lightheadedness and left-sided facial droop that was noted by his wife. He complained of no other neuro deficits. He did endorse a headache at the time. Patient endorses migraines. Also double vision occasionally.The changes are "side by side" brief.in the mornings and resolves quickly. He has some left arm fatigue from time-to-time. He is overall fatigued. Sleep study was normal. Was not on an aspirin when this happened and now is. No smoking or drug use. Unknown triggers. No alteration of awareness or seizure-like activity. Symptoms do resolve within a short period of time. The left-sided chest pain was dull ache and pain in his left jaw, had some nausea but no diaphoresis. Patient had a similar episode in 2014 where he complained of the same symptoms and his stroke evaluation was negative, he was admitted. He was found to have high triglycerides at the time but have normalized with treatment. No other focal neurologic deficits, associated symptoms, inciting events or modifiable factors.  Reviewed notes, labs  and imaging from outside physicians, which showed:  Ct of the head showed No acute intracranial abnormalities including mass lesion or mass effect, hydrocephalus, extra-axial fluid collection, midline shift, hemorrhage, or acute infarction, large ischemic events (personally reviewed images)     LDL 124 10/2016 and more recently 93 LDL. HgbA1c normal  Review of Systems: Patient complains of symptoms per HPI as well as the following symptoms: No current chest pain shortness of breath, fevers, chills, changes in bowel or bladder. Pertinent negatives and positives per HPI. All others negative.   Social History   Social History  . Marital status: Married    Spouse name: N/A  . Number of children: N/A  . Years of education: Master's   Occupational History  . [employer]    Social History Main Topics  . Smoking status: Never Smoker  . Smokeless tobacco: Never Used  . Alcohol use No     Comment: Occas glass of wine  . Drug use: No  . Sexual activity: Not on file   Other Topics Concern  . Not on file   Social History Narrative   Lives at home w/ wife and children   Right-handed   Caffeine: unsweet green tea a few times per week       Family History  Problem Relation Age of Onset  . Cancer Mother        Breast  . Dementia Mother   . Alzheimer's disease Mother   . Heart disease Father   . Coronary artery disease Father   . Heart attack  Father   . Kidney disease Sister   . Pancreatic cancer Maternal Grandmother     Past Medical History:  Diagnosis Date  . CHF (congestive heart failure) (HCC)   . CMV (cytomegalovirus infection) (HCC)   . Diastolic dysfunction 03/20/2016   Grade 1 diastolic dysfunction on echo in 2014.  Marland Kitchen. Malachi CarlEpstein Barr virus infection   . Hyperlipidemia   . Migraine     Past Surgical History:  Procedure Laterality Date  . COLONOSCOPY    . WISDOM TOOTH EXTRACTION      Current Outpatient Prescriptions  Medication Sig Dispense Refill  . aspirin 81 MG  chewable tablet Chew 81 mg by mouth daily.    Marland Kitchen. co-enzyme Q-10 30 MG capsule Take 30 mg by mouth daily.    . Omega-3 Fatty Acids (FISH OIL PO) Take 1 capsule by mouth daily.    . Red Yeast Rice Extract (RED YEAST RICE PO) Take by mouth.    . TURMERIC PO Take by mouth daily.    Marland Kitchen. ALPRAZolam (XANAX) 0.5 MG tablet Please take 1-2 tablets 30-60 minutes before MRI. May take one additional if needed. 4 tablet 0   No current facility-administered medications for this visit.     Allergies as of 06/18/2017  . (No Known Allergies)    Vitals: BP 103/70 (Patient Position: Standing)   Pulse 86   Ht 5\' 6"  (1.676 m)   Wt 181 lb 9.6 oz (82.4 kg)   BMI 29.31 kg/m  Last Weight:  Wt Readings from Last 1 Encounters:  06/18/17 181 lb 9.6 oz (82.4 kg)   Last Height:   Ht Readings from Last 1 Encounters:  06/18/17 5\' 6"  (1.676 m)    Physical exam: Exam: Gen: NAD, conversant, well nourised, obese, well groomed                     CV: RRR, no MRG. No Carotid Bruits. No peripheral edema, warm, nontender Eyes: Conjunctivae clear without exudates or hemorrhage  Neuro: Detailed Neurologic Exam  Speech:    Speech is normal; fluent and spontaneous with normal comprehension.  Cognition:    The patient is oriented to person, place, and time;     recent and remote memory intact;     language fluent;     normal attention, concentration,     fund of knowledge Cranial Nerves:    The pupils are equal, round, and reactive to light. The fundi are normal and spontaneous venous pulsations are present. Visual fields are full to finger confrontation. Extraocular movements are intact. Trigeminal sensation is intact and the muscles of mastication are normal. The face is symmetric. The palate elevates in the midline. Hearing intact. Voice is normal. Shoulder shrug is normal. The tongue has normal motion without fasciculations.   Coordination:    Normal finger to nose and heel to shin. Normal rapid alternating  movements.   Gait:    Heel-toe and tandem gait are normal.   Motor Observation:    No asymmetry, no atrophy, and no involuntary movements noted. Tone:    Normal muscle tone.    Posture:    Posture is normal. normal erect    Strength:    Strength is V/V in the upper and lower limbs.      Sensation: intact to LT     Reflex Exam:  DTR's:    Absent AJs otherwise Deep tendon reflexes in the upper and lower extremities are normal bilaterally.   Toes:  The toes are downgoing bilaterally.   Clonus:    Clonus is absent.      Assessment/Plan:  46 year old patient with episodes of left facial droop, headache. Stroke workup and admission in 2014 was negative, patient was seen in the emergency room several days ago with similar symptoms. May be complicated migraine however also in the setting of chest pain. Given his left facial droop, diplopia recommend a stroke workup including MRI of the brain and MRA of the head and neck to evaluate for strokes, lesions or masses, multiple sclerosis, aneurysm, cardioembolic disease or dissection. He is currently seeing cardiology will email his doctor and see if we can also order an echocardiogram with bubble study.  Addendum 8/29: Imaging of the blood vessels was normal. 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   Orders Placed This Encounter  Procedures  . MR BRAIN W WO CONTRAST  . MR MRA HEAD WO CONTRAST  . MR MRA NECK W WO CONTRAST   Cc: Mila Palmer, MD  Naomie Dean, MD  [facility] [address] [place], [place] [address]  Phone [phone] Fax [phone]

## 2017-06-18 NOTE — Telephone Encounter (Signed)
Encounter complete. 

## 2017-06-18 NOTE — Patient Instructions (Addendum)
Remember to drink plenty of fluid, eat healthy meals and do not skip any meals. Try to eat protein with a every meal and eat a healthy snack such as fruit or nuts in between meals. Try to keep a regular sleep-wake schedule and try to exercise daily, particularly in the form of walking, 20-30 minutes a day, if you can.   As far as your medications are concerned, I would like to suggest: ASA 325mg   As far as diagnostic testing: MRI/MRA brain/neck, echocardiogram  I would like to see you back in 8 weeks, sooner if we need to. Please call us with any interim questions, concerns, problems, updates or refill requests.   Our phone number is [phone]. We also have an after hours call service for urgent matters and there is a physician on-call for urgent questions. For any emergencies you know to call 911 or go to the nearest emergency room

## 2017-06-19 ENCOUNTER — Telehealth: Payer: Self-pay | Admitting: Neurology

## 2017-06-19 MED ORDER — ALPRAZOLAM 0.5 MG PO TABS: ORAL_TABLET | ORAL | 0 refills | Status: DC

## 2017-06-19 NOTE — Telephone Encounter (Signed)
Patient's wife called to see if she should take patient to the ER. She says he has face droop,   arms fine, discomfort in chest but no pain, BP elevated some. I advised she should take him to ER but she wanted Dr. Trevor MaceAhern's opinion. [facility] nurse and she advised him to go to ER for possible stroke or heart attack. I told patient's wife this but she wanted Dr. Trevor MaceAhern's opinion. Victorino DikeJennifer checked with Dr. Lucia GaskinsAhern who was seeing a patient and she also advised that the patient go to the ER. I told patient's wife this and she said she will take him to ER.

## 2017-06-19 NOTE — Telephone Encounter (Signed)
Kenneth Andrews, patient needs open MRI thanks

## 2017-06-20 ENCOUNTER — Ambulatory Visit (HOSPITAL_COMMUNITY)
Admission: RE | Admit: 2017-06-20 | Discharge: 2017-06-20 | Disposition: A | Discharge status: Home / Self Care | Payer: BLUE CROSS/BLUE SHIELD | Source: Ambulatory Visit | Attending: Internal Medicine | Admitting: Internal Medicine

## 2017-06-20 ENCOUNTER — Telehealth: Payer: Self-pay | Admitting: Neurology

## 2017-06-20 DIAGNOSIS — Z8249 Family history of ischemic heart disease and other diseases of the circulatory system: Secondary | ICD-10-CM | POA: Insufficient documentation

## 2017-06-20 DIAGNOSIS — R079 Chest pain, unspecified: Secondary | ICD-10-CM | POA: Insufficient documentation

## 2017-06-20 DIAGNOSIS — I509 Heart failure, unspecified: Secondary | ICD-10-CM | POA: Diagnosis not present

## 2017-06-20 LAB — MYOCARDIAL PERFUSION IMAGING
Estimated workload: 12.2 METS
Exercise duration (min): 11 min
Exercise duration (sec): 1 s
LV dias vol: 75 mL (ref 62–150)
LV sys vol: 34 mL
MPHR: 174 {beats}/min
Peak HR: 179 {beats}/min
Percent HR: 102 %
RPE: 18
Rest HR: 69 {beats}/min
SDS: 2
SRS: 1
SSS: 3
TID: 1.01

## 2017-06-20 MED ORDER — TECHNETIUM TC 99M TETROFOSMIN IV KIT
9.7000 | PACK | Freq: Once | INTRAVENOUS | Status: AC | PRN
  Administered 2017-06-20: 9.7 via INTRAVENOUS
  Filled 2017-06-20: qty 10

## 2017-06-20 MED ORDER — TECHNETIUM TC 99M TETROFOSMIN IV KIT
29.4000 | PACK | Freq: Once | INTRAVENOUS | Status: AC | PRN
  Administered 2017-06-20: 29.4 via INTRAVENOUS
  Filled 2017-06-20: qty 30

## 2017-06-20 NOTE — Telephone Encounter (Signed)
Xanax rx printed, signed and faxed to pharmacy. 

## 2017-06-20 NOTE — Telephone Encounter (Signed)
Dr Mila PalmerSharon Wolters office is calling for assistance in scheduling an MRI and MRA for pt as a result of ER f/u .  Nita from office  is asking to be called back

## 2017-06-20 NOTE — Progress Notes (Signed)
Normal pumping function, no obvious significant blockage

## 2017-06-21 NOTE — Telephone Encounter (Signed)
Noted  

## 2017-06-21 NOTE — Telephone Encounter (Signed)
Kenneth Andrews a voicemail for her to call me back about scheduling his MRI.

## 2017-06-21 NOTE — Telephone Encounter (Signed)
Kenneth Andrews called me back and I informed her that the patient wants an open MRI I would sent the order to [facility] and they will contact him to schedule. She understood.

## 2017-06-26 ENCOUNTER — Encounter: Payer: Self-pay | Admitting: Neurology

## 2017-06-27 NOTE — Telephone Encounter (Addendum)
Received faxed results from [facility]. MRI head showed "no acute abnormality, multiple small disrete white matter lesions are identified. These lesions are abnormal but nonspecific, usually resulting from the benign/remote/incidental causes. The pituitary gland appears small in size. 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 No discrete pituitary mass/adenoma appreciated."  MRA head showed Variant anatomy... No aneurysm, significant stenosis or major branch occlusion." MRA neck showed "no significant carotid or vertebral artery stenosis." Sent to med records for scanning, copy to Dr. Lucia GaskinsAhern for review.

## 2017-06-28 ENCOUNTER — Telehealth: Payer: Self-pay | Admitting: Neurology

## 2017-06-28 ENCOUNTER — Telehealth: Payer: Self-pay | Admitting: *Deleted

## 2017-06-28 DIAGNOSIS — R29810 Facial weakness: Secondary | ICD-10-CM

## 2017-06-28 NOTE — Telephone Encounter (Signed)
-----   Message from Chilton Si, MD sent at 06/20/2017  4:34 PM EDT ----- Regarding: FW: A mutual patient with [facility], regarding echo w/ bubble Please order transthoracic echo with bubble.  ----- Message ----- From: Anson Fret, MD Sent: 06/19/2017   8:29 PM To: Chilton Si, MD Subject: A mutual patient with [facility], regarding echo w/#  Hello Dr. Duke Salvia, we have a mutual patient. This patient is being seen for left facial droop that resolved. I suspect this may be complicated migraines however cannot rule out TIA. I believe he is seeing you for other workup and I was wondering if you could also order an echocardiogram with bubble study to complete a stroke workup. I am happy to order it, but I thought since he was seeing you that I would ask if you would like to facilitate this along with your other workup/orders. Please let me know, thank you very much Dr. Lucia Gaskins

## 2017-06-28 NOTE — Telephone Encounter (Signed)
Pt wife (on [place]) is asking if the MRI results can be reviewed by another Dr (since Dr Lucia Gaskins is out this and next week)for her husband so that they (pt and wife) can know that results and plan of action as soon as possible, please call

## 2017-06-28 NOTE — Telephone Encounter (Signed)
Attempted to call wife back x 2. Received recording stating the number I have dialed is "no longer in service".  Patient had MRI at [facility]. The physicians, radiologists at [facility] read the MRI's and send this office the report. Patient will most likely not be able to get the result until next week. Phone staff may inform patient/patient's wife is they call back.

## 2017-06-28 NOTE — Telephone Encounter (Signed)
Advised patient, order placed  Will send to scheduling

## 2017-07-02 NOTE — Telephone Encounter (Signed)
Patient's wife called. I advised her of previous message but she does not understand why we have not gotten the MRI report. Please call and discuss. She can be reached at [phone].

## 2017-07-04 NOTE — Telephone Encounter (Signed)
Received reports from Dr Marjory Lies. Wife returned call. Apologized for the delay in getting reports to patient. Advised her per Dr Marjory Lies the MRA head and MRA neck results are good results, no significant narrowing or occlusions noted. Advised the MRI brain results showed no acute abnormalities, did show small white matter changes, likely nonspecific incidental findings. Advised they can be related to headaches, diabetes, hypertension but do not require any immediate attention or follow up per Dr Marjory Lies. Advised her the MRI showed a somewhat small pituitary gland, and the reading physician recommended follow up MRI in 3-6 months. Dr Marjory Lies advised that the patient can review in detail with Dr Lucia Gaskins when she returns to the office. Advised her Dr Calton Dach dnot feel the MRI findings were re;lated to his symptoms.  Patient has follow up on 08/13/17. Wife verbalized understanding, appreciation of call.

## 2017-07-04 NOTE — Telephone Encounter (Signed)
Spoke with [facility] re: reports form patient's scans done there. Fax received, placed on Dr [employer] desk for review today.

## 2017-07-04 NOTE — Telephone Encounter (Signed)
LVM requesting call back for results. 

## 2017-07-10 ENCOUNTER — Encounter: Payer: Self-pay | Admitting: Neurology

## 2017-07-10 ENCOUNTER — Telehealth: Payer: Self-pay | Admitting: *Deleted

## 2017-07-10 ENCOUNTER — Other Ambulatory Visit: Payer: Self-pay

## 2017-07-10 ENCOUNTER — Ambulatory Visit (HOSPITAL_COMMUNITY): Payer: BLUE CROSS/BLUE SHIELD | Attending: Cardiology

## 2017-07-10 DIAGNOSIS — R29810 Facial weakness: Secondary | ICD-10-CM | POA: Insufficient documentation

## 2017-07-10 DIAGNOSIS — I509 Heart failure, unspecified: Secondary | ICD-10-CM | POA: Insufficient documentation

## 2017-07-10 NOTE — Telephone Encounter (Signed)
-----   Message from Anson Fret, MD sent at 07/10/2017 11:16 AM EDT ----- Imaging of the blood vessels was normal. 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

## 2017-07-10 NOTE — Telephone Encounter (Signed)
Called and spoke with patient. Relayed results per AA,MD note. He verbalized understanding and will f/u on 08/13/17 at 830am as scheduled.

## 2017-07-11 ENCOUNTER — Encounter: Payer: Self-pay | Admitting: Neurology

## 2017-08-13 ENCOUNTER — Encounter: Payer: Self-pay | Admitting: Neurology

## 2017-08-13 ENCOUNTER — Telehealth: Payer: Self-pay | Admitting: Neurology

## 2017-08-13 ENCOUNTER — Ambulatory Visit (INDEPENDENT_AMBULATORY_CARE_PROVIDER_SITE_OTHER): Payer: BLUE CROSS/BLUE SHIELD | Admitting: Neurology

## 2017-08-13 VITALS — BP 114/74 | HR 76 | Ht 66.0 in | Wt 183.4 lb

## 2017-08-13 DIAGNOSIS — R419 Unspecified symptoms and signs involving cognitive functions and awareness: Secondary | ICD-10-CM | POA: Diagnosis not present

## 2017-08-13 DIAGNOSIS — E237 Disorder of pituitary gland, unspecified: Secondary | ICD-10-CM | POA: Diagnosis not present

## 2017-08-13 DIAGNOSIS — H532 Diplopia: Secondary | ICD-10-CM

## 2017-08-13 MED ORDER — LORAZEPAM 2 MG PO TABS: ORAL_TABLET | ORAL | 0 refills | Status: DC

## 2017-08-13 NOTE — Telephone Encounter (Signed)
Noted, thank you

## 2017-08-13 NOTE — Patient Instructions (Signed)
EEG MRI brain with specialty pituitary imaging Ophthalmology referral Endocrine labs today

## 2017-08-13 NOTE — Telephone Encounter (Signed)
I was unable to get the MRI approved on my level. I called [employer] and spoke to one of their nurse's and she was unable to get it approved on her level. The phone number for the peer to peer is [phone]. The member ID is [id number] & DOB is [birth year]. The case does close on Thursday 08/15/17.

## 2017-08-13 NOTE — Progress Notes (Addendum)
[facility]    Provider:  Dr Lucia Gaskins Referring Provider: Mila Palmer, MD Primary Care Physician:  Mila Palmer, MD  CC:  Facial droop  Interval history 08/13/2017: Continues to have headaches, diplopia. Will repeat MRI brain with a pituitary protocol. Will check endocrine labs. Will also order an EEG to evaluate for epileptiform activity. If routine EEG negative needs a prolonged EEG. Discussed in detail differential and different treatments with patient and wife.  Addendum 8/29: Imaging of the blood vessels was normal. 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   HPI:  Kenneth Andrews is a 46 y.o. male here as a referral from Dr. Paulino Rily for facial droop. He is here with his wife who provides much information.  Patient had a facial droop, difficulty with cognition,  CP and a mild headache. Patient was taken to the emergency room where imaging and stroke workup were negative.  Patient was seen earlier this month complaining of chest pain and jaw pain, nausea, lightheadedness and left-sided facial droop that was noted by his wife. He complained of no other neuro deficits. He did endorse a headache at the time. Patient endorses migraines. Also double vision occasionally.The changes are "side by side" brief.in the mornings and resolves quickly. He has some left arm fatigue from time-to-time. He is overall fatigued. Sleep study was normal. Was not on an aspirin when this happened and now is. No smoking or drug use. Unknown triggers. No alteration of awareness or seizure-like activity. Symptoms do resolve within a short period of time. The left-sided chest pain was dull ache and pain in his left jaw, had some nausea but no diaphoresis.  Patient had a similar episode in 2014 where he complained of the same symptoms and his stroke evaluation was negative, he was admitted. He was found to have high triglycerides at the time but have normalized with treatment. No other focal neurologic deficits, associated symptoms, inciting events or modifiable factors.  Reviewed notes, labs and imaging from outside physicians, which showed:  Ct of the head showed No acute intracranial abnormalities including mass lesion or mass effect, hydrocephalus, extra-axial fluid collection, midline shift, hemorrhage, or acute infarction, large ischemic events (personally reviewed images)     LDL 124 10/2016 and more recently 93 LDL. HgbA1c normal   Review of Systems: Patient complains of symptoms per HPI as well as the following symptoms: headache, diplopia. Pertinent negatives and positives per HPI. All others negative.   Social History   Social History  . Marital status: Married    Spouse name: N/A  . Number of children: N/A  . Years of education: Master's   Occupational History  . [employer]    Social History Main Topics  . Smoking status: Never Smoker  . Smokeless tobacco: Never Used  . Alcohol use 0.6 oz/week    1 Glasses of wine per week     Comment: Occas glass of wine  . Drug use: No  . Sexual activity: Not on file   Other Topics Concern  . Not on file   Social History Narrative   Lives at home w/ wife and children   Right-handed   Caffeine: unsweet green tea a few times per week  Family History  Problem Relation Age of Onset  . Cancer Mother        Breast  . Dementia Mother   . Alzheimer's disease Mother   . Heart disease Father   . Coronary artery disease Father   . Heart attack Father   . Kidney disease Sister   . Pancreatic cancer Maternal Grandmother     Past Medical History:  Diagnosis Date  . CHF (congestive heart failure) (HCC)   . CMV (cytomegalovirus infection) (HCC)   . Diastolic dysfunction  03/20/2016   Grade 1 diastolic dysfunction on echo in 2014.  Marland Kitchen Malachi Carl virus infection   . Hyperlipidemia   . Migraine     Past Surgical History:  Procedure Laterality Date  . COLONOSCOPY    . WISDOM TOOTH EXTRACTION      Current Outpatient Prescriptions  Medication Sig Dispense Refill  . ALPRAZolam (XANAX) 0.5 MG tablet Please take 1-2 tablets 30-60 minutes before MRI. May take one additional if needed. 4 tablet 0  . aspirin 81 MG chewable tablet Chew 81 mg by mouth daily.    Marland Kitchen co-enzyme Q-10 30 MG capsule Take 30 mg by mouth daily.    Marland Kitchen L-Arginine POWD Take 2,000 mg by mouth.    . Omega-3 Fatty Acids (FISH OIL PO) Take 1 capsule by mouth daily.    . Red Yeast Rice Extract (RED YEAST RICE PO) Take by mouth.    . TURMERIC PO Take by mouth daily.    Marland Kitchen LORazepam (ATIVAN) 2 MG tablet Take 1 tabs 30 minutes before MRI may take one additional if needed before MRi 4 tablet 0   No current facility-administered medications for this visit.     Allergies as of 08/13/2017  . (No Known Allergies)    Vitals: BP 114/74 (BP Location: Right Arm, Patient Position: Sitting, Cuff Size: Small)   Pulse 76   Ht  (1.676 m)   Wt 183 lb 6.4 oz (83.2 kg)   BMI 29.60 kg/m  Last Weight:  Wt Readings from Last 1 Encounters:  08/13/17 183 lb 6.4 oz (83.2 kg)   Last Height:   Ht Readings from Last 1 Encounters:  08/13/17  (1.676 m)   Physical exam: Exam: Gen: NAD, conversant, well nourised, obese, well groomed                     CV: RRR, no MRG. No Carotid Bruits. No peripheral edema, warm, nontender Eyes: Conjunctivae clear without exudates or hemorrhage  Neuro: Detailed Neurologic Exam  Speech:    Speech is normal; fluent and spontaneous with normal comprehension.  Cognition:    The patient is oriented to person, place, and time;     recent and remote memory intact;     language fluent;     normal attention, concentration,     fund of knowledge Cranial Nerves:    The  pupils are equal, round, and reactive to light. Visual fields are full to finger confrontation. Extraocular movements are intact. Trigeminal sensation is intact and the muscles of mastication are normal. The face is symmetric. The palate elevates in the midline. Hearing intact. Voice is normal. Shoulder shrug is normal. The tongue has normal motion without fasciculations.   Coordination:    Normal finger to nose and heel to shin. Normal rapid alternating movements.   Gait:    Heel-toe and tandem gait are normal.   Motor Observation:    No asymmetry, no atrophy, and  no involuntary movements noted. Tone:    Normal muscle tone.    Posture:    Posture is normal. normal erect    Strength:    Strength is V/V in the upper and lower limbs.      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 Continues to have headaches, diplopia. Will repeat MRI brain with a pituitary protocol. Will check endocrine labs. Will also order an EEG to evaluate for epileptiform activity. If routine EEG negative needs a prolonged EEG. Discussed in detail differential and different treatments with patient and wife. We'll send to ophthalmology Dr. Dione Booze for complete ophthalmologic exam and visual field testing.  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  He never has diplopia for near as simply cannot diverge to see in the distance.  The deviation is too large to be a prismatic effect from glasses.  He is being sent to Dr. Daphine Deutscher but ultimately may need to see Dr. Melany Guernsey for strabismus surgery.  Orders Placed This Encounter  Procedures  . MR BRAIN  W WO CONTRAST  . CBC  . Comprehensive metabolic panel  . Prolactin  . Insulin-Like Growth Factor  . Hemoglobin A1c  . Thyroid Panel With TSH  . Testosterone  . Cortisol-am, blood  . Ambulatory referral to Ophthalmology  . EEG    Naomie Dean, MD  [facility] [address] [place], [place] [address]  Phone [phone] Fax [phone]  A total of 40 minutes was spent face-to-face with this patient. Over half this time was spent on counseling patient on the abnormal pituitary cyst diagnosis and different diagnostic and therapeutic options available.

## 2017-08-13 NOTE — Telephone Encounter (Signed)
[id number] valid [birth year]

## 2017-08-14 LAB — COMPREHENSIVE METABOLIC PANEL
ALT: 21 IU/L (ref 0–44)
AST: 18 IU/L (ref 0–40)
Albumin/Globulin Ratio: 1.9 (ref 1.2–2.2)
Albumin: 4.6 g/dL (ref 3.5–5.5)
Alkaline Phosphatase: 51 IU/L (ref 39–117)
BUN/Creatinine Ratio: 14 (ref 9–20)
BUN: 11 mg/dL (ref 6–24)
Bilirubin Total: 0.4 mg/dL (ref 0.0–1.2)
CO2: 24 mmol/L (ref 20–29)
Calcium: 9.7 mg/dL (ref 8.7–10.2)
Chloride: 101 mmol/L (ref 96–106)
Creatinine, Ser: 0.81 mg/dL (ref 0.76–1.27)
GFR calc Af Amer: 123 mL/min/{1.73_m2} (ref >59)
GFR calc non Af Amer: 107 mL/min/{1.73_m2} (ref >59)
Globulin, Total: 2.4 g/dL (ref 1.5–4.5)
Glucose: 102 mg/dL — ABNORMAL HIGH (ref 65–99)
Potassium: 4.3 mmol/L (ref 3.5–5.2)
Sodium: 140 mmol/L (ref 134–144)
Total Protein: 7 g/dL (ref 6.0–8.5)

## 2017-08-14 LAB — CBC
Hematocrit: 44 % (ref 37.5–51.0)
Hemoglobin: 15.1 g/dL (ref 13.0–17.7)
MCH: 29.3 pg (ref 26.6–33.0)
MCHC: 34.3 g/dL (ref 31.5–35.7)
MCV: 85 fL (ref 79–97)
Platelets: 265 10*3/uL (ref 150–379)
RBC: 5.15 x10E6/uL (ref 4.14–5.80)
RDW: 14 % (ref 12.3–15.4)
WBC: 7.5 10*3/uL (ref 3.4–10.8)

## 2017-08-14 LAB — THYROID PANEL WITH TSH
Free Thyroxine Index: 2 (ref 1.2–4.9)
T3 Uptake Ratio: 25 % (ref 24–39)
T4, Total: 8.1 ug/dL (ref 4.5–12.0)
TSH: 0.891 u[IU]/mL (ref 0.450–4.500)

## 2017-08-14 LAB — HEMOGLOBIN A1C
Est. average glucose Bld gHb Est-mCnc: 103 mg/dL
Hgb A1c MFr Bld: 5.2 % (ref 4.8–5.6)

## 2017-08-14 LAB — PROLACTIN: Prolactin: 10.6 ng/mL (ref 4.0–15.2)

## 2017-08-14 LAB — CORTISOL-AM, BLOOD: Cortisol - AM: 8.8 ug/dL (ref 6.2–19.4)

## 2017-08-14 LAB — INSULIN-LIKE GROWTH FACTOR: Insulin-Like GF-1: 75 ng/mL (ref 67–205)

## 2017-08-14 LAB — TESTOSTERONE: Testosterone: 300 ng/dL (ref 264–916)

## 2017-08-15 ENCOUNTER — Telehealth: Payer: Self-pay

## 2017-08-15 NOTE — Telephone Encounter (Signed)
I spoke with patient and made him aware that all labs were normal and he voiced understanding.

## 2017-08-15 NOTE — Telephone Encounter (Signed)
-----   Message from Geronimo Running, RN sent at 08/15/2017  7:53 AM EDT -----   ----- Message ----- From: Anson Fret, MD Sent: 08/14/2017   6:01 PM To: Geronimo Running, RN  All labs normal thanks

## 2017-08-21 ENCOUNTER — Encounter: Payer: Self-pay | Admitting: Neurology

## 2017-08-25 ENCOUNTER — Ambulatory Visit
Admission: RE | Admit: 2017-08-25 | Discharge: 2017-08-25 | Disposition: A | Discharge status: Home / Self Care | Payer: BLUE CROSS/BLUE SHIELD | Source: Ambulatory Visit | Attending: Neurology | Admitting: Neurology

## 2017-08-25 DIAGNOSIS — E237 Disorder of pituitary gland, unspecified: Secondary | ICD-10-CM | POA: Diagnosis not present

## 2017-08-25 MED ORDER — GADOBENATE DIMEGLUMINE 529 MG/ML IV SOLN
10.0000 mL | Freq: Once | INTRAVENOUS | Status: AC | PRN
  Administered 2017-08-25: 10 mL via INTRAVENOUS

## 2017-08-27 ENCOUNTER — Telehealth: Payer: Self-pay | Admitting: Neurology

## 2017-08-27 ENCOUNTER — Ambulatory Visit (INDEPENDENT_AMBULATORY_CARE_PROVIDER_SITE_OTHER): Payer: BLUE CROSS/BLUE SHIELD

## 2017-08-27 DIAGNOSIS — R419 Unspecified symptoms and signs involving cognitive functions and awareness: Secondary | ICD-10-CM

## 2017-08-27 DIAGNOSIS — R2990 Unspecified symptoms and signs involving the nervous system: Secondary | ICD-10-CM | POA: Diagnosis not present

## 2017-08-27 NOTE — Telephone Encounter (Signed)
MRI results not yet reviewed. Please see if Dr. Lucia Gaskins will review this for patient asap.

## 2017-08-27 NOTE — Procedures (Signed)
      History: Kenneth Andrews is a 26 are old gentleman with a history of episodic left-sided facial droop. The patient also has a history of migraine headache. He may also reports some left arm fatigue from time to time. The patient is being evaluated for these events.  This is a routine EEG. No skull defects are noted. Medications include Xanax, aspirin, coenzyme Q 10, Ativan, and red yeast rice.  EEG classification: Dysrhythmia grade 2 left greater than right hemisphere  Description the recording: The background rhythms of this recording consists of a well modulated medium amplitude alpha rhythm of 11 Hz that is reactive to eye opening and closure. As the record progresses, photic stimulation is performed and results in a bilateral and symmetric photic driving response. Hyperventilation is then performed resulting in some buildup of the background rhythm activities with slight symmetric slowing seen. Following hyperventilation, there appears to be episodes of 1-2 seconds of bursts of higher amplitude 6 Hz theta activity that is asymmetric, usually occurring in the left hemisphere, but occasionally this is more prominent in the right hemisphere. At no time during the recording does there appear to be actual spike or spike-wave discharges. EKG monitor shows no evidence of cardiac rhythm abnormalities with a heart rate of 78.  Impression: This is an abnormal EEG secondary to episodic brief 6 Hz bursts of theta slowing that are asymmetric, usually occurring from the left hemisphere but occasionally in noted from the right hemisphere independently. This study does suggest a lowered seizure threshold, no electrographic seizures were noted.

## 2017-08-27 NOTE — Telephone Encounter (Signed)
Patient in the lobby for EEG wanting to know results of MRI he had on Sunday. Best call back is [phone]

## 2017-08-28 ENCOUNTER — Other Ambulatory Visit: Payer: Self-pay | Admitting: Neurology

## 2017-08-28 ENCOUNTER — Telehealth: Payer: Self-pay | Admitting: Neurology

## 2017-08-28 MED ORDER — TOPIRAMATE 25 MG PO TABS: ORAL_TABLET | ORAL | 3 refills | Status: DC

## 2017-08-28 NOTE — Telephone Encounter (Signed)
Bethany :) I need to order [facility] labs on this patient, specifically anti-musk and possibly some other myasthenia gravis antibodies. Would you be able to help me with that? Also please fill out a [facility]vative diagnostics form for eeg for this patient please THANK YOU!

## 2017-08-28 NOTE — Telephone Encounter (Signed)
Discussed MRi and eeg with patient. Will order extended eeg due to abnormal routine eeg. Will order myasthenia gravis labs. Also start topiramate for complicated migraines vs partial seizures

## 2017-08-28 NOTE — Telephone Encounter (Signed)
MRI results are back. Message sent to DR. Ahern for review. SHe is seeing patients today.

## 2017-08-28 NOTE — Telephone Encounter (Signed)
Pt has called back and is asking for RN of Dr Lucia GaskinsAhern to please give him a call re: his recent results.  Pt states if he is not available on mobil # please don't leave a message.  Instead please call [phone]

## 2017-08-28 NOTE — Telephone Encounter (Signed)
Pt's wife called said he is expecting a call from Dr Lucia GaskinsAhern reg an email from yesterday. He can be reached at [phone] or [phone]

## 2017-08-29 NOTE — Telephone Encounter (Signed)
Per Dr. Lucia GaskinsAhern, she is aware of MRI results and has d/w pt.

## 2017-08-30 ENCOUNTER — Other Ambulatory Visit: Payer: Self-pay | Admitting: Neurology

## 2017-08-30 MED ORDER — PYRIDOSTIGMINE BROMIDE 60 MG PO TABS: ORAL_TABLET | ORAL | 6 refills | Status: AC

## 2017-09-02 NOTE — Telephone Encounter (Addendum)
Ne[facility] form for 72 hr signed & faxed to [phone]. Receipt of confirmation received.

## 2017-09-03 ENCOUNTER — Telehealth: Payer: Self-pay | Admitting: *Deleted

## 2017-09-03 NOTE — Telephone Encounter (Signed)
Pt's wife called reg setting up home EEG and labs to be ordered. She is aware the order has been put in for the in home EEG, she would like the phone number. She wants to know when he should have labs. Please call to advise

## 2017-09-03 NOTE — Telephone Encounter (Signed)
We received a paper prescription request for Mestinon. I called the pharmacy. They report that the medication was filled on 10/20 and do not see any pending requests.

## 2017-09-04 ENCOUNTER — Encounter: Payer: Self-pay | Admitting: Neurology

## 2017-09-04 NOTE — Telephone Encounter (Addendum)
[facility] called and needed additional patient information. They also made [place] aware that 4 tests on the Myasthenia Gravis panel #1513, 1490, 1480, 1481 will cover everything that the MD wants to test on the Myasthenia Gravis panel #1. They will mail the kit to the patient and then a lab team will come to his home to draw the labs. They will also contact the patient regarding patient cost assistance. They will fax updated requisition to [place].

## 2017-09-04 NOTE — Telephone Encounter (Signed)
I called the patient. He is aware that [facility] are being ordered. I faxed the completed order form over to him # [phone] and received his signature, then [facility] # [phone]. Received a receipt of confirmation.

## 2017-09-05 ENCOUNTER — Other Ambulatory Visit: Payer: Self-pay | Admitting: Neurology

## 2017-09-05 DIAGNOSIS — G7000 Myasthenia gravis without (acute) exacerbation: Secondary | ICD-10-CM

## 2017-09-13 ENCOUNTER — Encounter: Payer: Self-pay | Admitting: Neurology

## 2017-09-15 ENCOUNTER — Other Ambulatory Visit: Payer: Self-pay | Admitting: Neurology

## 2017-09-15 ENCOUNTER — Telehealth: Payer: Self-pay | Admitting: Neurology

## 2017-09-15 NOTE — Telephone Encounter (Signed)
Kenneth Andrews, please print out a lab order for anti-musk (muscle specific kinase) so that they can take it to [facility] and have it done. Not sure why the [facility] in [place] can order this test but our lab cannot. See me in themorning if you can;t find it thanks

## 2017-09-15 NOTE — Progress Notes (Signed)
Cardiology Office Note   Date:  09/16/2017   ID:  Kenneth Andrews, DOB 1970/12/05, MRN [id number]  PCP:  Kenneth Palmer, MD  Cardiologist:   Kenneth Si, MD   No chief complaint on file.    History of Present Illness: Kenneth Andrews is a 46 y.o. male with chronic diastolic heart failure, TIA vs. anxiety and hypertriglyceridemia who presents for follow up.  He was first seen 03/2016 for an evaluation of family history of premature coronary artery disease.  At that appointment he reported two years of atypical chest pain.  He was referred for an ETT 04/11/16 that was negative for ischemia.  He achieved 10.1 METs on a Bruce Protocol.  He followed up with Kenneth Course, PA on 06/2017 after being seen in the ED with chest pain reproducible with palpation.  He was also evaluated for possible stroke.  Cardiac enzymes were negative x2 and head CT was unremarkable.  He was referred for an exercise Myoveiw that was again negative for ischemia.  He had an echo 07/09/17 that revealed LVEF 60-65% with grade one diastolic dysfunction and a negative bubble study. He also followed up with Dr. Lucia Andrews for facial droop.  He had an MRI of the head that showed no acute abnormality but did show multiple small, discrete white matter lesions.     Kenneth Andrews has otherwise been feeling well.  He walks 20-40 minutes for exercise daily.  He has no chest pain or shortness of breath with this activity.  He denies lower extremity edema, orthopnea, or PND.  He has noted some mild lightheadedness upon standing that lasts for a few seconds.  He has no syncope.  Last month while walking back to his car from a concert he had lightheadedness that lasted up to a minute.  His primary care provider has been following his cholesterol.  He prefers natural treatments and has been taking red yeast rice and celery juice.  He does not drink alcohol and has been losing weight by changing his diet.   Past Medical History:  Diagnosis Date    . CHF (congestive heart failure) (HCC)   . CMV (cytomegalovirus infection) (HCC)   . Diastolic dysfunction 03/20/2016   Grade 1 diastolic dysfunction on echo in 2014.  Marland Kitchen Kenneth Andrews virus infection   . Hyperlipidemia   . Migraine     Past Surgical History:  Procedure Laterality Date  . COLONOSCOPY    . WISDOM TOOTH EXTRACTION       Current Outpatient Medications  Medication Sig Dispense Refill  . aspirin 81 MG chewable tablet Chew 81 mg by mouth daily.    . B Complex-C (B-COMPLEX WITH VITAMIN C) tablet Take 1 tablet daily by mouth.    . co-enzyme Q-10 30 MG capsule Take 30 mg by mouth daily.    Marland Kitchen L-Arginine POWD Take 2,000 mg by mouth.    . Omega-3 Fatty Acids (FISH OIL PO) Take 1 capsule by mouth daily.    Marland Kitchen pyridostigmine (MESTINON) 60 MG tablet Take 3-4 times a day. Start with 1/2 pill and increase to a whole pill as tolerated 120 tablet 6  . Red Yeast Rice Extract (RED YEAST RICE PO) Take by mouth.    . TURMERIC PO Take by mouth daily.    Marland Kitchen VITAMIN D, ERGOCALCIFEROL, PO Take 4,000 Units daily by mouth.     No current facility-administered medications for this visit.     Allergies:   Patient has no known allergies.  Social History:  The patient  reports that  has never smoked. he has never used smokeless tobacco. He reports that he drinks about 0.6 oz of alcohol per week. He reports that he does not use drugs.   Family History:  The patient's family history includes Alzheimer's disease in his mother; Cancer in his mother; Coronary artery disease in his father; Dementia in his mother; Heart attack in his father; Heart disease in his father; Kidney disease in his sister; Pancreatic cancer in his maternal grandmother.    ROS:  Please see the history of present illness.   Otherwise, review of systems are positive for none.   All other systems are reviewed and negative.    PHYSICAL EXAM: VS:  BP 112/62   Pulse 75   Ht 5\' 6"  (1.676 m)   Wt 79.9 kg (176 lb 3.2 oz)   BMI  28.44 kg/m  , BMI Body mass index is 28.44 kg/m. GENERAL:  Well appearing.  No acute distress HEENT: Pupils equal round and reactive, fundi not visualized, oral mucosa unremarkable NECK:  No jugular venous distention, waveform within normal limits, carotid upstroke brisk and symmetric, no bruits, no thyromegaly LUNGS:  Clear to auscultation bilaterally HEART:  RRR.  PMI not displaced or sustained,S1 and S2 within normal limits, no S3, no S4, no clicks, no rubs, no murmurs ABD:  Flat, positive bowel sounds normal in frequency in pitch, no bruits, no rebound, no guarding, no midline pulsatile mass, no hepatomegaly, no splenomegaly EXT:  2 plus pulses throughout, no edema, no cyanosis no clubbing SKIN:  No rashes no nodules NEURO:  Cranial nerves II through XII grossly intact, motor grossly intact throughout PSYCH:  Cognitively intact, oriented to person place and time   EKG:  EKG is not ordered today. The ekg ordered 03/20/16 demonstrateSinus rhythm. Rate 90 bpm.  Recent Labs: 08/13/2017: ALT 21; BUN 11; Creatinine, Ser 0.81; Hemoglobin 15.1; Platelets 265; Potassium 4.3; Sodium 140; TSH 0.891   10/13/15: AST 12, ALT 13 TSH 0.8 Hemoglobin 15.2 Total cholesterol 195, HDL 37, triglycerides 174, LDL 124  Echo 06/15/13: LVEF 50-55%. Grade 1 diastolic dysfunction.  ETT 04/11/16:  There was no ST segment deviation noted during stress.  Normal study with no ischeia.  Normal exericse capacity.  Normal BP and heart rate response to exercise.  This is a low risk study.   Exercise Myoview 06/20/17:   The left ventricular ejection fraction is normal (55-65%).  Nuclear stress EF: 55%.  There was no ST segment deviation noted during stress.  The study is normal.  This is a low risk study.   Normal exercise nuclear stress test with no evidence for prior infarct or ischemia. Excellent exercise capacity. Normal BP response to stress.   Echo 07/10/17: Study Conclusions  - Left  ventricle: The cavity size was normal. Systolic function was   normal. The estimated ejection fraction was in the range of 60%   to 65%. Wall motion was normal; there were no regional wall   motion abnormalities. Doppler parameters are consistent with   abnormal left ventricular relaxation (grade 1 diastolic   dysfunction). - Atrial septum: Echo contrast study showed no right-to-left atrial   level shunt, at baseline or with provocation.  Lipid Panel    Component Value Date/Time   CHOL 169 06/14/2017 1001   TRIG 229 (H) 06/14/2017 1001   HDL 30 (L) 06/14/2017 1001   CHOLHDL 5.6 (H) 06/14/2017 1001   CHOLHDL 9.2 06/15/2013 0150   VLDL  UNABLE TO CALCULATE IF TRIGLYCERIDE OVER 400 mg/dL 96/02/5408 8119   LDLCALC 93 06/14/2017 1001      Wt Readings from Last 3 Encounters:  09/16/17 79.9 kg (176 lb 3.2 oz)  08/13/17 83.2 kg (183 lb 6.4 oz)  06/20/17 84.4 kg (186 lb)      ASSESSMENT AND PLAN:  # Atypical chest pain: Resolved.  Stress test was normal 03/2016 and 06/2017.    # Diastolic dysfunction: Mr. Magan was noted to have grade 1 diastolic dysfunction on his echo in 2014. He has no signs or symptoms of heart failure. Blood pressure is well-controlled.   # Lightheadedness:  Mr. Cauthorn was not orthostatic on exam today.  There are no alarm symptoms and he has not had syncope.  No changes at this time.  Current medicines are reviewed at length with the patient today.  The patient does not have concerns regarding medicines.  The following changes have been made:  no change  Labs/ tests ordered today include:   No orders of the defined types were placed in this encounter.    Disposition:   FU with Tavious Griesinger C. Duke Salvia, MD, [facility] as needed.   This note was written with the assistance of speech recognition software.  Please excuse any transcriptional errors.  Signed, Tanasha Menees C. Duke Salvia, MD, [facility]  09/16/2017 8:41 AM    [facility]

## 2017-09-16 ENCOUNTER — Ambulatory Visit: Payer: BLUE CROSS/BLUE SHIELD | Admitting: Cardiovascular Disease

## 2017-09-16 ENCOUNTER — Other Ambulatory Visit: Payer: Self-pay | Admitting: Neurology

## 2017-09-16 ENCOUNTER — Encounter: Payer: Self-pay | Admitting: Cardiovascular Disease

## 2017-09-16 VITALS — BP 112/62 | HR 75 | Ht 66.0 in | Wt 176.2 lb

## 2017-09-16 DIAGNOSIS — G7000 Myasthenia gravis without (acute) exacerbation: Secondary | ICD-10-CM

## 2017-09-16 DIAGNOSIS — I5189 Other ill-defined heart diseases: Secondary | ICD-10-CM

## 2017-09-16 DIAGNOSIS — I519 Heart disease, unspecified: Secondary | ICD-10-CM

## 2017-09-16 DIAGNOSIS — R42 Dizziness and giddiness: Secondary | ICD-10-CM | POA: Diagnosis not present

## 2017-09-16 NOTE — Telephone Encounter (Signed)
Pt's wife called today reg when order could be pick up this morning. The pt is off today and is wanting to get it done this morning. Please call her at [phone]

## 2017-09-16 NOTE — Patient Instructions (Signed)
Medication Instructions:  ?Your physician recommends that you continue on your current medications as directed. Please refer to the Current Medication list given to you today.  ? ?Labwork: ?NONE ? ?Testing/Procedures: ?NONE ? ?Follow-Up: ?AS NEEDED  ? ?  ?

## 2017-09-16 NOTE — Telephone Encounter (Signed)
Order for anti-musk written by Dr. Lucia GaskinsAhern and printed. Pt called and aware order will be ready for pickup at the front desk. He verbalized understanding.

## 2017-09-26 LAB — MUSK ANTIBODIES: MuSK Antibodies: <1 U/mL

## 2017-09-30 ENCOUNTER — Telehealth: Payer: Self-pay | Admitting: Neurology

## 2017-09-30 NOTE — Telephone Encounter (Signed)
Called pt's wife Lucita LoraCorazon, informed her that we do not have any lab results. She will call the [facility] location that Thelma BargeFrancis had his labs at and will ask for results to be faxed to our office.

## 2017-09-30 NOTE — Telephone Encounter (Signed)
We have not received any labs, please ask her to call [facility] where she [facility] the labs to see if they will fax to us. Or ask our [facility] person to try and get the results. She [facility] a different [facility] office than ours thanks

## 2017-09-30 NOTE — Telephone Encounter (Signed)
Patient's wife calling to get lab results. °

## 2017-10-02 ENCOUNTER — Ambulatory Visit: Payer: BLUE CROSS/BLUE SHIELD | Admitting: Internal Medicine

## 2017-10-02 ENCOUNTER — Encounter: Payer: Self-pay | Admitting: Internal Medicine

## 2017-10-02 VITALS — BP 120/72 | HR 74 | Wt 176.0 lb

## 2017-10-02 DIAGNOSIS — E236 Other disorders of pituitary gland: Secondary | ICD-10-CM | POA: Diagnosis not present

## 2017-10-02 NOTE — Progress Notes (Addendum)
Patient ID: Kenneth Andrews, male   DOB: [birth year], 46 y.o.   MRN: [id number]    HPI  Kenneth Andrews is a 46 y.o.-year-old male, referred by his PCP, Dr.Wolters, for evaluation for pituitary cyst.  Pt. has been dx with a pituitary cyst in 2018 and this was incidentally found on a brain MRI obtained in the setting of migraines.  Retrospectively, comparing the images of his 08/25/2017 brain MRI with a once obtaining 2014, there is no change in the appearance of the cyst.  His pituitary gland appears to be slightly displaced to the left, but not compressed.    Pt c/o: - migraines - double vision - started ~ 4 years ago, but progressively worse He had a TIA in 2014  (L facial droop, L arm numbness) and this repeated summer 2018.  He feels that the double vision started approximately before his TIA.  Neurology suspects ocular myastenia gravis (ophthalmology ruled this out) >> on Mestinon >> he does not feel this is helping. He had eye exams repeatedly >> VF reportedly normal, but high B intraocular pressure.   Also: - fatigue - weakness - no hair loss - dry skin  He had recent comprehensive pituitary hormone investigation by Dr Lucia GaskinsAhern, his neurologist, with all normal labs: Component     Latest Ref Rng & Units 08/13/2017  TSH     0.450 - 4.500 uIU/mL 0.891  Thyroxine (T4)     4.5 - 12.0 ug/dL 8.1  T3 Uptake Ratio     24 - 39 % 25  Free Thyroxine Index     1.2 - 4.9 2.0  Prolactin     4.0 - 15.2 ng/mL 10.6  Insulin-Like GF-1     67 - 205 ng/mL 75  Testosterone     264 - 916 ng/dL 409300  Cortisol - AM     6.2 - 19.4 ug/dL 8.8    Pt. also has a history of CHF. He saw cardiology. + FH of heart ds - father  - massive AMI - died at 46 y/o.  ROS: Constitutional: + see HPI, poor sleep Eyes: + Double vision, no xerophthalmia ENT: no sore throat, + nodules palpated in throat, no dysphagia/odynophagia, no hoarseness, + hypoacusis Cardiovascular: no CP/SOB/palpitations/leg  swelling Respiratory: no cough/SOB Gastrointestinal: no N/V/D/C Musculoskeletal: + Both: Muscle/joint aches Skin: no rashes Neurological: no tremors/numbness/tingling/dizziness, + headache Psychiatric: no depression/anxiety  Past Medical History:  Diagnosis Date  . CHF (congestive heart failure) (HCC)   . CMV (cytomegalovirus infection) (HCC)   . Diastolic dysfunction 03/20/2016   Grade 1 diastolic dysfunction on echo in 2014.  Marland Kitchen. Malachi CarlEpstein Barr virus infection   . Hyperlipidemia   . Migraine    Past Surgical History:  Procedure Laterality Date  . COLONOSCOPY    . WISDOM TOOTH EXTRACTION     Social History   Socioeconomic History  . Marital status: Married    Spouse name: Not on file  . Number of children: 2  . Years of education: Master's  Social Needs  Occupational History  . Occupation:  Oncologistnetwork operations center director  Tobacco Use  . Smoking status: Never Smoker  . Smokeless tobacco: Never Used  Substance and Sexual Activity  . Alcohol use: Yes    Alcohol/week: 0.6 oz    Types: 1 Glasses of wine per week    Comment: Occas glass of wine  . Drug use: No  . Sexual activity: Not on file  Other Topics Concern  . Not on file  Social History Narrative   Lives at home w/ wife and children   Right-handed   Caffeine: unsweet green tea a few times per week   Current Outpatient Medications on File Prior to Visit  Medication Sig Dispense Refill  . aspirin 81 MG chewable tablet Chew 81 mg by mouth daily.    . B Complex-C (B-COMPLEX WITH VITAMIN C) tablet Take 1 tablet daily by mouth.    . co-enzyme Q-10 30 MG capsule Take 30 mg by mouth daily.    . Omega-3 Fatty Acids (FISH OIL PO) Take 1 capsule by mouth daily.    Marland Kitchen. pyridostigmine (MESTINON) 60 MG tablet Take 3-4 times a day. Start with 1/2 pill and increase to a whole pill as tolerated 120 tablet 6  . Red Yeast Rice Extract (RED YEAST RICE PO) Take by mouth.    . TURMERIC PO Take by mouth daily.    Marland Kitchen. VITAMIN D,  ERGOCALCIFEROL, PO Take 4,000 Units daily by mouth.    Marland Kitchen. L-Arginine POWD Take 2,000 mg by mouth.     No current facility-administered medications on file prior to visit.    No Known Allergies Family History  Problem Relation Age of Onset  . Cancer Mother        Breast  . Dementia Mother   . Alzheimer's disease Mother   . Heart disease Father   . Coronary artery disease Father   . Heart attack Father   . Kidney disease Sister   . Pancreatic cancer Maternal Grandmother     PE: BP 120/72 (BP Location: Left Arm, Patient Position: Sitting)   Pulse 74   Wt 176 lb (79.8 kg)   SpO2 96%   BMI 28.41 kg/m  Wt Readings from Last 3 Encounters:  10/02/17 176 lb (79.8 kg)  09/16/17 176 lb 3.2 oz (79.9 kg)  08/13/17 183 lb 6.4 oz (83.2 kg)   Constitutional: overweight, in NAD Eyes: PERRLA, EOMI, no exophthalmos ENT: moist mucous membranes, no thyromegaly, no cervical lymphadenopathy Cardiovascular: RRR, No MRG Respiratory: CTA B Gastrointestinal: abdomen soft, NT, ND, BS+ Musculoskeletal: no deformities, strength intact in all 4 Skin: moist, warm, no rashes Neurological: no tremor with outstretched hands, DTR normal in all 4  ASSESSMENT: 1. Pituitary cyst  PLAN:  1. Patient with a newly diagnosed pituitary cyst, incidentally found during investigation for headaches.  Retrospectively, reviewing the images of his MRI from 2018 and the one from 2014, the cyst has been present before and has not increased in size.  I reassured the patient that the cyst was probably there for his entire life.   - We reviewed the images of his 2018 MRI with the patient and I explained how the cyst forms and showed him how it displaces the pituitary gland.  However, I explained that his pituitary hormones were completely normal, therefore, the presence of the cyst did not cause any sign of hypopituitarism. - I explained that there are 4 types of cysts (also given written information about this), and they do  not usually require further intervention unless the size is large and causes impingement syndromes: Headaches, visual field cuts; or pituitary deficiencies.  He does have a history of migraines and also has a curious history of double vision, however, this would be unlikely to be related to the small cyst.  However, I would like to review personally the visual field obtained by his ophthalmologist and the patient will let me know about his name so we can ask for records.  He signed a release of information form at today's visit.  After this, I would also like to ask the opinion of the neuro endocrinologist at [facility] whether he may benefit from a visit there for neurosurgical consultation. - Patient agrees with the plan  - We will not scheduled for a follow-up for now but may change this depending on the results of the visual field.  Patient Instructions  You do not need further labs for your pituitary cyst. The hormonal workup was normal.  Please let me know the name of your ophthalmologist so I can ask for the records for visual field.   Please review the following information [facility]):  Pituitary Cysts   A variety of cysts can appear within the pituitary gland. Although some pituitary tumors can have cystic components, most cysts are not true tumors. The most common cyst is a Rathke's cleft cyst (RCC); others include arachnoid cysts, pars intermedia cysts and epidermoid cysts. Rathke's Cleft Cysts Rathke's cleft is a normal part of the developing pituitary gland in the fetus. In some people, the cleft becomes filled with liquid, creating a Rathke's cleft cyst. Symptoms Although the majority of cysts are small and asymptomatic, some are noticed, either incidentally or because of specific symptoms, including: Headaches  Visual deficits  Pituitary hormone deficiency - in women of childbearing years, this will affect menstrual cycles; in men, this causes a decreased sex drive Pituitary  Cysts Diagnosis & Treatment MRI, CT scans and a visual exam help determine the presence and type of pituitary cyst. Because most people with Rathke's cleft cysts do not have symptoms, most do not need surgery. While a small Rathke's cleft cyst in a well person who has no problems (visual or endocrine) requires no therapy, those with large cysts who have visual deficits should be treated with surgery. Surgery Virtually all Rathke's cleft cysts can be operated on through the nasal passages without an external incision on the skin. Your surgeon may either attempt to remove the entire cyst wall or drain the cyst and remove only a portion of the wall.   Content was created using [facility]. Edits to original content made by [employer] and [facility] [place] [facility] [place]. This information is not a substitute for professional medical advice.  Received records from [facility].  Visual field performed on 08/21/2017 was normal.  Carlus Pavlov, MD PhD [facility] Endocrinology

## 2017-10-02 NOTE — Patient Instructions (Addendum)
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 The hormonal workup was normal.  Please let me know the name of your ophthalmologist so I can ask for the records for visual field.   Please review the following information [facility]):  Pituitary Cysts   A variety of cysts can appear within the pituitary gland. Although some pituitary tumors can have cystic components, most cysts are not true tumors. The most common cyst is a Rathke's cleft cyst (RCC); others include arachnoid cysts, pars intermedia cysts and epidermoid cysts. Rathke's Cleft Cysts Rathke's cleft is a normal part of the developing pituitary gland in the fetus. In some people, the cleft becomes filled with liquid, creating a Rathke's cleft cyst. Symptoms Although the majority of cysts are small and asymptomatic, some are noticed, either incidentally or because of specific symptoms, including: Headaches  Visual deficits  Pituitary hormone deficiency - in women of childbearing years, this will affect menstrual cycles; in men, this causes a decreased sex drive Pituitary Cysts Diagnosis & Treatment MRI, CT scans and a visual exam help determine the presence and type of pituitary cyst. Because most people with Rathke's cleft cysts do not have symptoms, most do not need surgery. While a small Rathke's cleft cyst in a well person who has no problems (visual or endocrine) requires no therapy, those with large cysts who have visual deficits should be treated with surgery. Surgery Virtually all Rathke's cleft cysts can be operated on through the nasal passages without an external incision on the skin. Your surgeon may either attempt to remove the entire cyst wall or drain the cyst and remove only a portion of the wall.   Content was created using [facility]. Edits to original content made by [employer] and [employer] [facility] [employer] [place]. This information is not a substitute for professional medical  advice.

## 2017-10-07 ENCOUNTER — Telehealth: Payer: Self-pay | Admitting: *Deleted

## 2017-10-07 NOTE — Telephone Encounter (Signed)
Called & LVM (ok per DPR) to discuss appt on 11/27. Referral had been [facility] to [facility] & Dr. Lucia GaskinsAhern wanted pt to see Dr. Terrace ArabiaYan. Call is to f/u on these things as appt w/ Dr. Lucia GaskinsAhern likely not be necessary tomorrow.

## 2017-10-07 NOTE — Telephone Encounter (Signed)
-----   Message from Anson FretAntonia B Ahern, MD sent at 10/07/2017 11:12 AM EST ----- Musk negative thanks

## 2017-10-07 NOTE — Telephone Encounter (Signed)
Spoke to patient's wife - appt scheduled on 11/13/16 with Dr. Terrace ArabiaYan (aware to arrive at 3:30pm).

## 2017-10-07 NOTE — Telephone Encounter (Signed)
Kenneth Andrews, does not need to be asap thanks, first available with Dr. Terrace ArabiaYan.

## 2017-10-07 NOTE — Telephone Encounter (Signed)
Called pt's wife, Lucita LoraCorazon (on [place]), she is aware that his MUSK lab is negative. His f/u appt is scheduled tomorrow w/ Dr. Lucia GaskinsAhern. However, Dr. Lucia GaskinsAhern wanted pt to see Dr. Terrace ArabiaYan for a second opinion. Corazon states she had called and spoke with someone in scheduling and they said they would be able to change the appt so the pt could see Dr. Terrace ArabiaYan. They would like to see Dr. Terrace ArabiaYan for a 2nd opinion. She also states that the pt has not received a call yet from [facility] (Dr. Georgina PillionMassey).

## 2017-10-08 ENCOUNTER — Ambulatory Visit: Payer: BLUE CROSS/BLUE SHIELD | Admitting: Neurology

## 2017-10-08 NOTE — Telephone Encounter (Signed)
Dr. Dione BoozeGroat ' referral has been sent. Thanks Annabelle Harmanana.

## 2017-10-14 ENCOUNTER — Encounter: Payer: Self-pay | Admitting: Internal Medicine

## 2017-10-17 ENCOUNTER — Ambulatory Visit: Payer: BLUE CROSS/BLUE SHIELD | Admitting: Neurology

## 2017-10-17 ENCOUNTER — Encounter: Payer: Self-pay | Admitting: Neurology

## 2017-10-17 DIAGNOSIS — IMO0002 Reserved for concepts with insufficient information to code with codable children: Secondary | ICD-10-CM

## 2017-10-17 DIAGNOSIS — H532 Diplopia: Secondary | ICD-10-CM | POA: Diagnosis not present

## 2017-10-17 DIAGNOSIS — G43709 Chronic migraine without aura, not intractable, without status migrainosus: Secondary | ICD-10-CM | POA: Diagnosis not present

## 2017-10-17 DIAGNOSIS — R5383 Other fatigue: Secondary | ICD-10-CM | POA: Diagnosis not present

## 2017-10-17 MED ORDER — RIZATRIPTAN BENZOATE 5 MG PO TBDP: 5.0000 mg | ORAL_TABLET | ORAL | 11 refills | Status: AC | PRN

## 2017-10-17 NOTE — Progress Notes (Signed)
PATIENT: Kenneth Andrews DOB: 10/03/1971  Chief Complaint  Patient presents with  . Myasthenia Gravis    He is here for further evaluation of possible MG.  Reports symptoms of headache, double vision and fatigue (referred by Dr. Lucia GaskinsAhern).     HISTORICAL  Kenneth CityFrancis Andrews is a 46 year old male, seen in refer by my colleague Dr. Lucia GaskinsAhern for evaluation of intermittent right-sided droopy eyelid, rule out neuromuscular junctional disorder, initial evaluation by me on October 17, 2017.  He was initially referred to Dr. Lucia GaskinsAhern for evaluation of facial droop in Oct 2018,  he works as a Surveyor, mineralscomputer engineer, with past medical history of hyperlipidemia.  He complains of few years history of intermittent double vision, especially looking distance, seems to do well looking close, he noticed binocular double vision while driving, tends to close his one eye for a while, blinks could help too.  He denies significant limb muscle weakness, no dysarthria, no dysphagia, he also noticed intermittent right-sided droopy eyelid, most noticeable when he is fatigued,  He has history of migraine headaches, it is usually left side lateralized moderate pounding headache with associated light noise sensitivity, smell sensitivity, he is taking NSAIDs as needed.  MRI of the brain with and without contrast on June 26, 2017 with pituitary protocol showed CSF signal at the right side of sella turcica, no change compared to previous noncontrast scan the brain in August 2014, few supratentorium small vessel disease.  MRA of the brain and neck in August 2018 from outside hospital showed no significant abnormality.  He has constellation of complaints including intermittent headaches, dizziness, difficulty focusing, generalized fatigue, reported previous normal sleep study.  Intermittent left chest pain, was evaluated in the emergency room in August 2018, no cardiac etiology found,  He denies correlation of his intermittent  right droopy eyelid related to his headaches,  Had extensive laboratory evaluations, Musk antibody was negative.  But I do not see acetylcholine receptor antibody  Rest of laboratory evaluation was normal or negative a.m. cortisol level, testosterone, thyroid functional test, A1c 5.2, insulin like growth factor-1, prolactin, CMP, CBC, mild elevated triglyceride 229, ]Abnormal EEG, episodic brief 6 Hz burst of theta slowing, that are asymmetric usually occurring from the left hemisphere, but occasionally from the right hemisphere,  REVIEW OF SYSTEMS: Full 14 system review of systems performed and notable only for as above  ALLERGIES: No Known Allergies  HOME MEDICATIONS: Current Outpatient Medications  Medication Sig Dispense Refill  . aspirin 81 MG chewable tablet Chew 81 mg by mouth daily.    . B Complex-C (B-COMPLEX WITH VITAMIN C) tablet Take 1 tablet daily by mouth.    . co-enzyme Q-10 30 MG capsule Take 30 mg by mouth daily.    Marland Kitchen. L-Arginine POWD Take 2,000 mg by mouth.    . Omega-3 Fatty Acids (FISH OIL PO) Take 1 capsule by mouth daily.    Marland Kitchen. pyridostigmine (MESTINON) 60 MG tablet Take 3-4 times a day. Start with 1/2 pill and increase to a whole pill as tolerated 120 tablet 6  . Red Yeast Rice Extract (RED YEAST RICE PO) Take by mouth.    . TURMERIC PO Take by mouth daily.    Marland Kitchen. VITAMIN D, ERGOCALCIFEROL, PO Take 4,000 Units daily by mouth.     No current facility-administered medications for this visit.     PAST MEDICAL HISTORY: Past Medical History:  Diagnosis Date  . CHF (congestive heart failure) (HCC)   . CMV (cytomegalovirus infection) (HCC)   .  Diastolic dysfunction 03/20/2016   Grade 1 diastolic dysfunction on echo in 2014.  Marland Kitchen. Malachi CarlEpstein Barr virus infection   . Hyperlipidemia   . Migraine     PAST SURGICAL HISTORY: Past Surgical History:  Procedure Laterality Date  . COLONOSCOPY    . WISDOM TOOTH EXTRACTION      FAMILY HISTORY: Family History  Problem Relation  Age of Onset  . Cancer Mother        Breast  . Dementia Mother   . Alzheimer's disease Mother   . Heart disease Father   . Coronary artery disease Father   . Heart attack Father   . Kidney disease Sister   . Pancreatic cancer Maternal Grandmother     SOCIAL HISTORY:  Social History   Socioeconomic History  . Marital status: Married    Spouse name: Not on file  . Number of children: Not on file  . Years of education: Master's  . Highest education level: Not on file  Social Needs  . Financial resource strain: Not on file  . Food insecurity - worry: Not on file  . Food insecurity - inability: Not on file  . Transportation needs - medical: Not on file  . Transportation needs - non-medical: Not on file  Occupational History  . Occupation: [employer]  Tobacco Use  . Smoking status: Never Smoker  . Smokeless tobacco: Never Used  Substance and Sexual Activity  . Alcohol use: Yes    Alcohol/week: 0.6 oz    Types: 1 Glasses of wine per week    Comment: Occas glass of wine  . Drug use: No  . Sexual activity: Not on file  Other Topics Concern  . Not on file  Social History Narrative   Lives at home w/ wife and children   Right-handed   Caffeine: unsweet green tea a few times per week     PHYSICAL EXAM   Vitals:   10/17/17 0807  BP: 109/71  Pulse: 71  Weight: 175 lb (79.4 kg)  Height: 5\' 6"  (1.676 m)    Not recorded      Body mass index is 28.25 kg/m.  PHYSICAL EXAMNIATION:  Gen: NAD, conversant, well nourised, obese, well groomed                     Cardiovascular: Regular rate rhythm, no peripheral edema, warm, nontender. Eyes: Conjunctivae clear without exudates or hemorrhage Neck: Supple, no carotid bruits. Pulmonary: Clear to auscultation bilaterally   NEUROLOGICAL EXAM:  MENTAL STATUS: Speech:    Speech is normal; fluent and spontaneous with normal comprehension.  Cognition:     Orientation to time, place and person     Normal recent and remote  memory     Normal Attention span and concentration     Normal Language, naming, repeating,spontaneous speech     Fund of knowledge   CRANIAL NERVES: CN II: Visual fields are full to confrontation. Fundoscopic exam is normal with sharp discs and no vascular changes. Pupils are round equal and briskly reactive to light. CN III, IV, VI: extraocular movement are normal. No ptosis. CN V: Facial sensation is intact to pinprick in all 3 divisions bilaterally. Corneal responses are intact.  CN VII: Face is symmetric with normal eye closure and smile.  There is a low-lying right upper eye lid likely due to redundant soft tissue, no significant facial muscle weakness noted. CN VIII: Hearing is normal to rubbing fingers CN IX, X: Palate elevates symmetrically. Phonation is  normal. CN XI: Head turning and shoulder shrug are intact CN XII: Tongue is midline with normal movements and no atrophy.  MOTOR: There is no pronator drift of out-stretched arms. Muscle bulk and tone are normal. Muscle strength is normal.  REFLEXES: Reflexes are 2+ and symmetric at the biceps, triceps, knees, and ankles. Plantar responses are flexor.  SENSORY: Intact to light touch, pinprick, positional sensation and vibratory sensation are intact in fingers and toes.  COORDINATION: Rapid alternating movements and fine finger movements are intact. There is no dysmetria on finger-to-nose and heel-knee-shin.    GAIT/STANCE: Posture is normal. Gait is steady with normal steps, base, arm swing, and turning. Heel and toe walking are normal. Tandem gait is normal.  Romberg is absent.   DIAGNOSTIC DATA (LABS, IMAGING, TESTING) - I reviewed patient records, labs, notes, testing and imaging myself where available.   ASSESSMENT AND PLAN  Shiva Sahagian is a 46 y.o. male   Intermittent horizontal double vision, intermittent right-sided droopy eyelid  Acetylcholine receptor antibody to rule out neuromuscular junctional  disorder,  Other possibility also include migraine-related   Chronic migraine headache  Sub-optimal response to NSAIDs, Maxalt 5 mg as needed   Levert Feinstein, M.D. Ph.D.  [facility] [address] [place], [place] [address] Ph: [phone] Fax: [phone]  CC: Referring Provider

## 2017-10-23 ENCOUNTER — Encounter: Payer: Self-pay | Admitting: Neurology

## 2017-10-23 NOTE — Telephone Encounter (Signed)
[facility] order form completed and signed by MD for 24 hr EEG. Order faxed along with office visits and office EEG. Received a receipt of confirmation.

## 2017-10-23 NOTE — Telephone Encounter (Signed)
Per Claudette LawsBeau, Beau spoke with the patient and informed him that Dr. Lucia GaskinsAhern would be placing an order for a home 24 hr EEG through [facility].

## 2017-10-23 NOTE — Telephone Encounter (Signed)
Patient called and requested to speak with someone regarding a 24 hour EEG he was supposed to have, he called the office and someone in the sleep lab informed him that we do not do those here. He wants to know where he is supposed to pick up the equipment for the 24 hour EEG. Please call and advise.

## 2017-10-25 LAB — VITAMIN B12: Vitamin B-12: 470 pg/mL (ref 232–1245)

## 2017-10-25 LAB — VITAMIN D 25 HYDROXY (VIT D DEFICIENCY, FRACTURES): Vit D, 25-Hydroxy: 29.2 ng/mL — ABNORMAL LOW (ref 30.0–100.0)

## 2017-10-25 LAB — ACETYLCHOLINE RECEPTOR AB, ALL
AChR Binding Ab, Serum: <0.03 nmol/L (ref 0.00–0.24)
Acetylchol Block Ab: 17 % (ref 0–25)
Acetylcholine Modulat Ab: <12 % (ref 0–20)

## 2017-11-13 ENCOUNTER — Institutional Professional Consult (permissible substitution): Payer: Self-pay | Admitting: Neurology

## 2017-11-15 ENCOUNTER — Encounter: Payer: Self-pay | Admitting: Neurology

## 2017-11-27 ENCOUNTER — Encounter: Payer: Self-pay | Admitting: Neurology

## 2017-12-06 ENCOUNTER — Encounter: Payer: Self-pay | Admitting: Neurology

## 2018-03-31 ENCOUNTER — Emergency Department (HOSPITAL_BASED_OUTPATIENT_CLINIC_OR_DEPARTMENT_OTHER)
Admission: EM | Admit: 2018-03-31 | Discharge: 2018-04-01 | Disposition: A | Discharge status: Home / Self Care | Payer: BLUE CROSS/BLUE SHIELD | Attending: Emergency Medicine | Admitting: Emergency Medicine

## 2018-03-31 ENCOUNTER — Encounter (HOSPITAL_BASED_OUTPATIENT_CLINIC_OR_DEPARTMENT_OTHER): Payer: Self-pay | Admitting: Emergency Medicine

## 2018-03-31 ENCOUNTER — Other Ambulatory Visit: Payer: Self-pay

## 2018-03-31 ENCOUNTER — Emergency Department (HOSPITAL_BASED_OUTPATIENT_CLINIC_OR_DEPARTMENT_OTHER): Payer: BLUE CROSS/BLUE SHIELD

## 2018-03-31 DIAGNOSIS — Z8673 Personal history of transient ischemic attack (TIA), and cerebral infarction without residual deficits: Secondary | ICD-10-CM | POA: Diagnosis not present

## 2018-03-31 DIAGNOSIS — R42 Dizziness and giddiness: Secondary | ICD-10-CM | POA: Diagnosis not present

## 2018-03-31 DIAGNOSIS — H539 Unspecified visual disturbance: Secondary | ICD-10-CM | POA: Diagnosis not present

## 2018-03-31 DIAGNOSIS — Z79899 Other long term (current) drug therapy: Secondary | ICD-10-CM | POA: Diagnosis not present

## 2018-03-31 DIAGNOSIS — Z7982 Long term (current) use of aspirin: Secondary | ICD-10-CM | POA: Insufficient documentation

## 2018-03-31 DIAGNOSIS — I5032 Chronic diastolic (congestive) heart failure: Secondary | ICD-10-CM | POA: Diagnosis not present

## 2018-03-31 DIAGNOSIS — R002 Palpitations: Secondary | ICD-10-CM | POA: Insufficient documentation

## 2018-03-31 LAB — BASIC METABOLIC PANEL
Anion gap: 11 (ref 5–15)
BUN: 15 mg/dL (ref 6–20)
CO2: 25 mmol/L (ref 22–32)
Calcium: 9 mg/dL (ref 8.9–10.3)
Chloride: 105 mmol/L (ref 101–111)
Creatinine, Ser: 0.83 mg/dL (ref 0.61–1.24)
GFR calc Af Amer: >60 mL/min (ref >60)
GFR calc non Af Amer: >60 mL/min (ref >60)
Glucose, Bld: 102 mg/dL — ABNORMAL HIGH (ref 65–99)
Potassium: 3.8 mmol/L (ref 3.5–5.1)
Sodium: 141 mmol/L (ref 135–145)

## 2018-03-31 LAB — CBC
HCT: 37.1 % — ABNORMAL LOW (ref 39.0–52.0)
Hemoglobin: 13.6 g/dL (ref 13.0–17.0)
MCH: 29.9 pg (ref 26.0–34.0)
MCHC: 36.7 g/dL — ABNORMAL HIGH (ref 30.0–36.0)
MCV: 81.5 fL (ref 78.0–100.0)
Platelets: 286 10*3/uL (ref 150–400)
RBC: 4.55 MIL/uL (ref 4.22–5.81)
RDW: 12.9 % (ref 11.5–15.5)
WBC: 8.4 10*3/uL (ref 4.0–10.5)

## 2018-03-31 LAB — TROPONIN I
Troponin I: <0.03 ng/mL (ref <0.03)
Troponin I: <0.03 ng/mL (ref <0.03)

## 2018-03-31 NOTE — ED Notes (Signed)
States," I don't have pain in my chest but  it just feels funny" Has been having numbness from neck to left arm today.

## 2018-03-31 NOTE — ED Notes (Signed)
Patient transported to CT 

## 2018-03-31 NOTE — ED Triage Notes (Signed)
Patient states that he started to feel clammy and lighthead a couple of time at noon and then looked at his apple watch and saw that his heart was between 110 - 125. The patient reports that he is having "a funny feeling" to his left arm. He reports that his heart rate was jumping up and down from noon to about 5 pm

## 2018-04-01 NOTE — Discharge Instructions (Addendum)
Return for any new or worse symptoms.  Today's work-up had 2- cardiac enzymes of the discomfort in the left neck left arm probably not representative an acute heart event.  Would recommend follow-up with your primary care doctor and consideration for cardiac monitoring due to the feeling of palpitations.  For the visual changes possibly could have been a mild TIA you have had similar symptoms in the past without proof of any stroke.  But would recommend follow-up with neurology and your primary care doctor for this.  Return for any new or worse stroke symptoms return for any racing heart lasting an hour or longer return for any worse chest pain.  Work note provided.

## 2018-04-01 NOTE — ED Provider Notes (Signed)
[facility] EMERGENCY DEPARTMENT Provider Note   CSN: [id number] Arrival date & time: 03/31/18  1719     History   Chief Complaint Chief Complaint  Patient presents with  . Palpitations    HPI Kenneth Andrews is a 47 y.o. male.  Patient was not feeling well earlier today starting in the morning hours he had some discomfort left neck radiating to the left arm.  Felt clammy and lightheaded.  Had some visual changes.  In the afternoon his AppleWorks showed that his heart rate went from 1 10-1 25 several times he checked his blood pressure several times and it was like 140/90.  He felt that he had a irregular heart rate from noon to 5 PM.  No anterior chest pain.  Patient's had was felt to be TIA symptoms in the past followed by neurology has had MRIs no specific findings.  Has not had any heart palpitation problems in the past.  Does have a primary care doctor.  No history of any cardiac problems in the past.  History of some diastolic dysfunction in 2017 on echo.  Risk factor for hyperlipidemia.  No known history of cardiac events.     Past Medical History:  Diagnosis Date  . CHF (congestive heart failure) (HCC)   . CMV (cytomegalovirus infection) (HCC)   . Diastolic dysfunction 03/20/2016   Grade 1 diastolic dysfunction on echo in 2014.  Marland Kitchen Malachi Carl virus infection   . Hyperlipidemia   . Migraine     Patient Active Problem List   Diagnosis Date Noted  . Chronic migraine 10/17/2017  . Fatigue 10/17/2017  . Diplopia 10/17/2017  . 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 Upper airway cough syndrome 01/28/2017  . Diastolic dysfunction 03/20/2016  . Chronic diastolic congestive heart failure (HCC) 06/16/2013  . TIA (transient ischemic attack) 06/15/2013  . Chest pain 06/15/2013  . Hypertriglyceridemia 06/15/2013    Past Surgical History:  Procedure Laterality Date  . COLONOSCOPY    . WISDOM TOOTH EXTRACTION          Home Medications    Prior to Admission  medications   Medication Sig Start Date End Date Taking? Authorizing Provider  aspirin 81 MG chewable tablet Chew 81 mg by mouth daily.    [provider]  B Complex-C (B-COMPLEX WITH VITAMIN C) tablet Take 1 tablet daily by mouth.    [provider]  co-enzyme Q-10 30 MG capsule Take 30 mg by mouth daily.    [provider]  L-Arginine POWD Take 2,000 mg by mouth.    [provider]  Omega-3 Fatty Acids (FISH OIL PO) Take 1 capsule by mouth daily.    [provider]  pyridostigmine (MESTINON) 60 MG tablet Take 3-4 times a day. Start with 1/2 pill and increase to a whole pill as tolerated 08/30/17   Anson Fret, MD  Red Yeast Rice Extract (RED YEAST RICE PO) Take by mouth.    [provider]  rizatriptan (MAXALT-MLT) 5 MG disintegrating tablet Take 1 tablet (5 mg total) by mouth as needed. May repeat in 2 hours if needed 10/17/17   Levert Feinstein, MD  TURMERIC PO Take by mouth daily.    [provider]  VITAMIN D, ERGOCALCIFEROL, PO Take 4,000 Units daily by mouth.    [provider]    Family History Family History  Problem Relation Age of Onset  . Cancer Mother        Breast  . Dementia  Mother   . Alzheimer's disease Mother   . Heart disease Father   . Coronary artery disease Father   . Heart attack Father   . Kidney disease Sister   . Pancreatic cancer Maternal Grandmother     Social History Social History   Tobacco Use  . Smoking status: Never Smoker  . Smokeless tobacco: Never Used  Substance Use Topics  . Alcohol use: Yes    Alcohol/week: 0.6 oz    Types: 1 Glasses of wine per week    Comment: Occas glass of wine  . Drug use: No     Allergies   Patient has no known allergies.   Review of Systems Review of Systems  Constitutional: Negative for fever.  HENT: Negative for congestion.   Eyes: Positive for visual disturbance.  Respiratory: Negative for shortness of breath.   Cardiovascular:  Positive for chest pain and palpitations. Negative for leg swelling.  Gastrointestinal: Negative for abdominal pain.  Genitourinary: Negative for dysuria.  Musculoskeletal: Positive for neck pain. Negative for back pain.  Skin: Negative for rash.  Neurological: Positive for light-headedness. Negative for syncope, weakness and numbness.  Hematological: Does not bruise/bleed easily.  Psychiatric/Behavioral: Negative for confusion.     Physical Exam Updated Vital Signs BP 128/72   Pulse 87   Temp 98.7 F (37.1 C) (Oral)   Resp (!) 22   Ht 1.676 m ( )   Wt 77.1 kg (170 lb)   SpO2 98%   BMI 27.44 kg/m   Physical Exam  Constitutional: He is oriented to person, place, and time. He appears well-developed and well-nourished. No distress.  HENT:  Head: Normocephalic and atraumatic.  Mouth/Throat: Oropharynx is clear and moist.  Eyes: Pupils are equal, round, and reactive to light. Conjunctivae and EOM are normal.  Neck: Neck supple.  Cardiovascular: Normal rate, regular rhythm and normal heart sounds.  Pulmonary/Chest: Effort normal and breath sounds normal. No respiratory distress.  Abdominal: Soft. Bowel sounds are normal. There is no tenderness.  Musculoskeletal: Normal range of motion. He exhibits no edema.  Neurological: He is alert and oriented to person, place, and time. No cranial nerve deficit or sensory deficit. He exhibits normal muscle tone. Coordination normal.  Hint of slight weakness to left lower extremity.  Very subtle.  No upper extremity weakness.  No cranial nerve deficits.  Skin: Skin is warm. No rash noted.  Nursing note and vitals reviewed.    ED Treatments / Results  Labs (all labs ordered are listed, but only abnormal results are displayed) Labs Reviewed  BASIC METABOLIC PANEL - Abnormal; Notable for the following components:      Result Value   Glucose, Bld 102 (*)    All other components within normal limits  CBC - Abnormal; Notable for the  following components:   HCT 37.1 (*)    MCHC 36.7 (*)    All other components within normal limits  TROPONIN I  TROPONIN I    EKG EKG Interpretation  Date/Time:  Monday Mar 31 2018 17:34:51 EDT Ventricular Rate:  88 PR Interval:  124 QRS Duration: 86 QT Interval:  340 QTC Calculation: 411 R Axis:   43 Text Interpretation:  Normal sinus rhythm Normal ECG Confirmed by Vanetta Mulders [phone]) on 03/31/2018 10:13:23 PM   Radiology Dg Chest 2 View  Result Date: 03/31/2018 CLINICAL DATA:  Palpitations. Shortness of breath and dizziness. Chest pain. EXAM: CHEST - 2 VIEW COMPARISON:  06/13/2017 FINDINGS: Midline trachea.  Normal heart size and  mediastinal contours. Sharp costophrenic angles.  No pneumothorax.  Clear lungs. IMPRESSION: No active cardiopulmonary disease. Electronically Signed   By: Jeronimo Greaves M.D.   On: 03/31/2018 18:11   Ct Head Wo Contrast  Result Date: 03/31/2018 CLINICAL DATA:  Lightheaded EXAM: CT HEAD WITHOUT CONTRAST TECHNIQUE: Contiguous axial images were obtained from the base of the skull through the vertex without intravenous contrast. COMPARISON:  MRI 08/25/2017, CT brain 06/13/2017 FINDINGS: Brain: No acute territorial infarction, hemorrhage or intracranial mass. Mild atrophy. Stable ventricle size Vascular: No hyperdense vessels.  No unexpected calcification Skull: Normal. Negative for fracture or focal lesion. Sinuses/Orbits: Mucous retention cyst right maxillary sinus. No acute orbital abnormality Other: None IMPRESSION: 1. No CT evidence for acute intracranial abnormality. 2. Mild atrophy Electronically Signed   By: Jasmine Pang M.D.   On: 03/31/2018 23:52    Procedures Procedures (including critical care time)  Medications Ordered in ED Medications - No data to display   Initial Impression / Assessment and Plan / ED Course  I have reviewed the triage vital signs and the nursing notes.  Pertinent labs & imaging results that were available during my  care of the patient were reviewed by me and considered in my medical decision making (see chart for details).     Work-up here patient with cardiac monitoring without any arrhythmia blood pressures normalized and have been normal here.  No tachycardia.  Initial EKG also showed no tachycardia no arrhythmias.  Patient's initial troponin was negative.  Repeat troponin done was also negative.  Patient's symptoms of left neck pain left arm pain starting in the morning and constantly continuing most likely not an acute cardiac event.  EKG was as stated without any acute findings.  Basic labs were normal chest x-ray negative patient without any real shortness of breath.  Patient with some unusual visual changes little bit of lightheadedness when he was driving this morning some very subtle left lower extremity weakness the patient subjectively had noticed raised concern so head CT was done.  There was negative TIA not completely ruled out however MRI not available clinically I feel particularly patient's past medical history of similar events followed by neurology that transfer in for MRI tonight is not warranted.  Patient will return for any new or worse symptoms at all.  Do recommend follow-up with his care doctor consideration for cardiac monitoring.  Final Clinical Impressions(s) / ED Diagnoses   Final diagnoses:  Palpitations  Visual changes    ED Discharge Orders    None       Vanetta Mulders, MD 04/01/18 0020

## 2018-04-01 NOTE — ED Notes (Signed)
ED Provider at bedside. 

## 2018-07-10 DIAGNOSIS — H5319 Other subjective visual disturbances: Secondary | ICD-10-CM | POA: Diagnosis not present

## 2018-07-10 DIAGNOSIS — H50012 Monocular esotropia, left eye: Secondary | ICD-10-CM | POA: Diagnosis not present

## 2018-07-10 DIAGNOSIS — H518 Other specified disorders of binocular movement: Secondary | ICD-10-CM | POA: Diagnosis not present

## 2018-07-10 DIAGNOSIS — H5213 Myopia, bilateral: Secondary | ICD-10-CM | POA: Diagnosis not present

## 2018-08-05 DIAGNOSIS — M542 Cervicalgia: Secondary | ICD-10-CM | POA: Diagnosis not present

## 2018-08-05 DIAGNOSIS — M47812 Spondylosis without myelopathy or radiculopathy, cervical region: Secondary | ICD-10-CM | POA: Diagnosis not present

## 2018-08-05 DIAGNOSIS — M5030 Other cervical disc degeneration, unspecified cervical region: Secondary | ICD-10-CM | POA: Diagnosis not present

## 2018-08-05 DIAGNOSIS — M9901 Segmental and somatic dysfunction of cervical region: Secondary | ICD-10-CM | POA: Diagnosis not present

## 2018-08-07 DIAGNOSIS — M5030 Other cervical disc degeneration, unspecified cervical region: Secondary | ICD-10-CM | POA: Diagnosis not present

## 2018-08-07 DIAGNOSIS — M47812 Spondylosis without myelopathy or radiculopathy, cervical region: Secondary | ICD-10-CM | POA: Diagnosis not present

## 2018-08-07 DIAGNOSIS — M542 Cervicalgia: Secondary | ICD-10-CM | POA: Diagnosis not present

## 2018-08-07 DIAGNOSIS — M9901 Segmental and somatic dysfunction of cervical region: Secondary | ICD-10-CM | POA: Diagnosis not present

## 2018-10-31 IMAGING — DX DG CHEST 2V
2 series · 2 of 2 positions shown · non-contrast
Comparison: 06/13/2017

CLINICAL DATA: Palpitations. Shortness of breath and dizziness.
Chest pain.

EXAM:
CHEST - 2 VIEW

[chest pa]
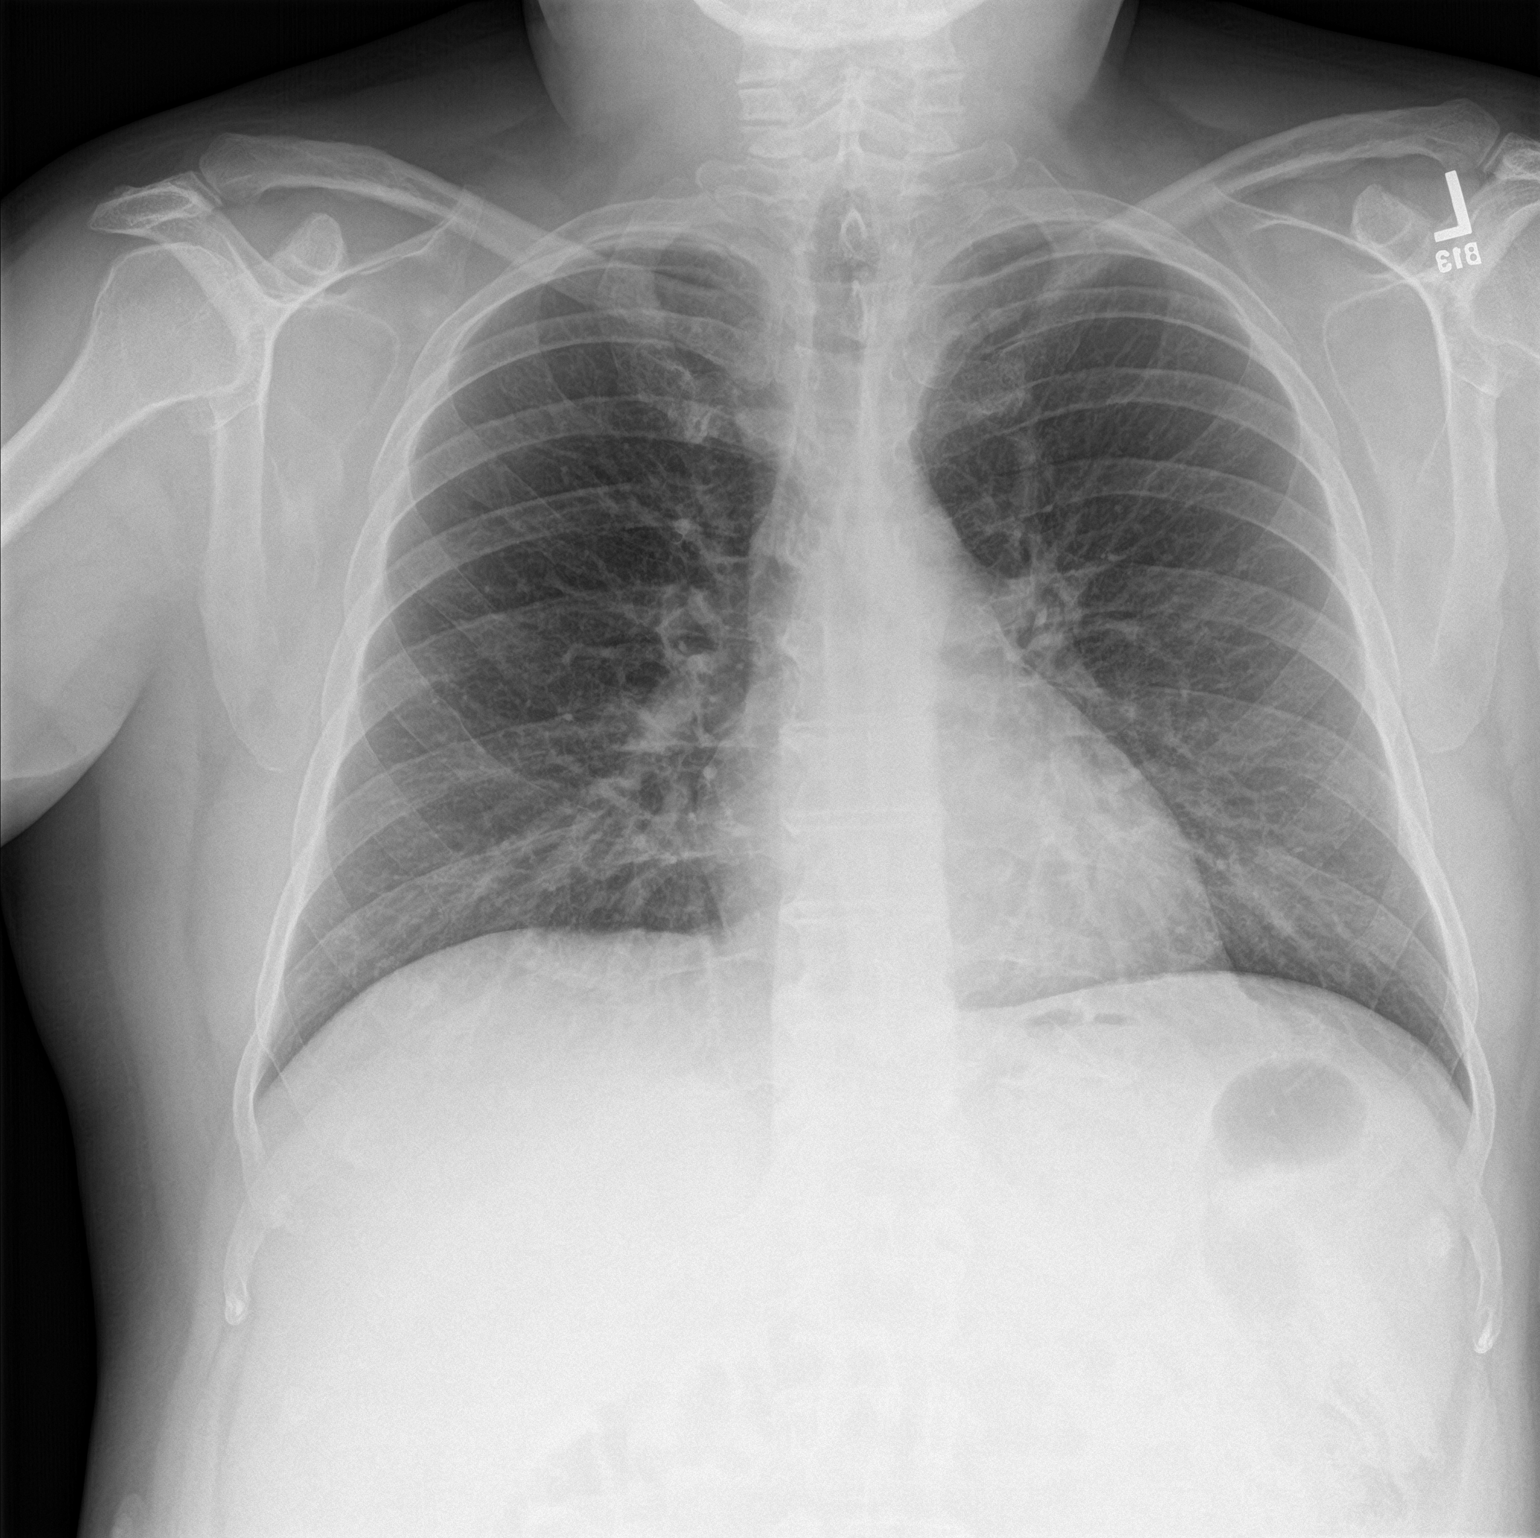

[chest lat]
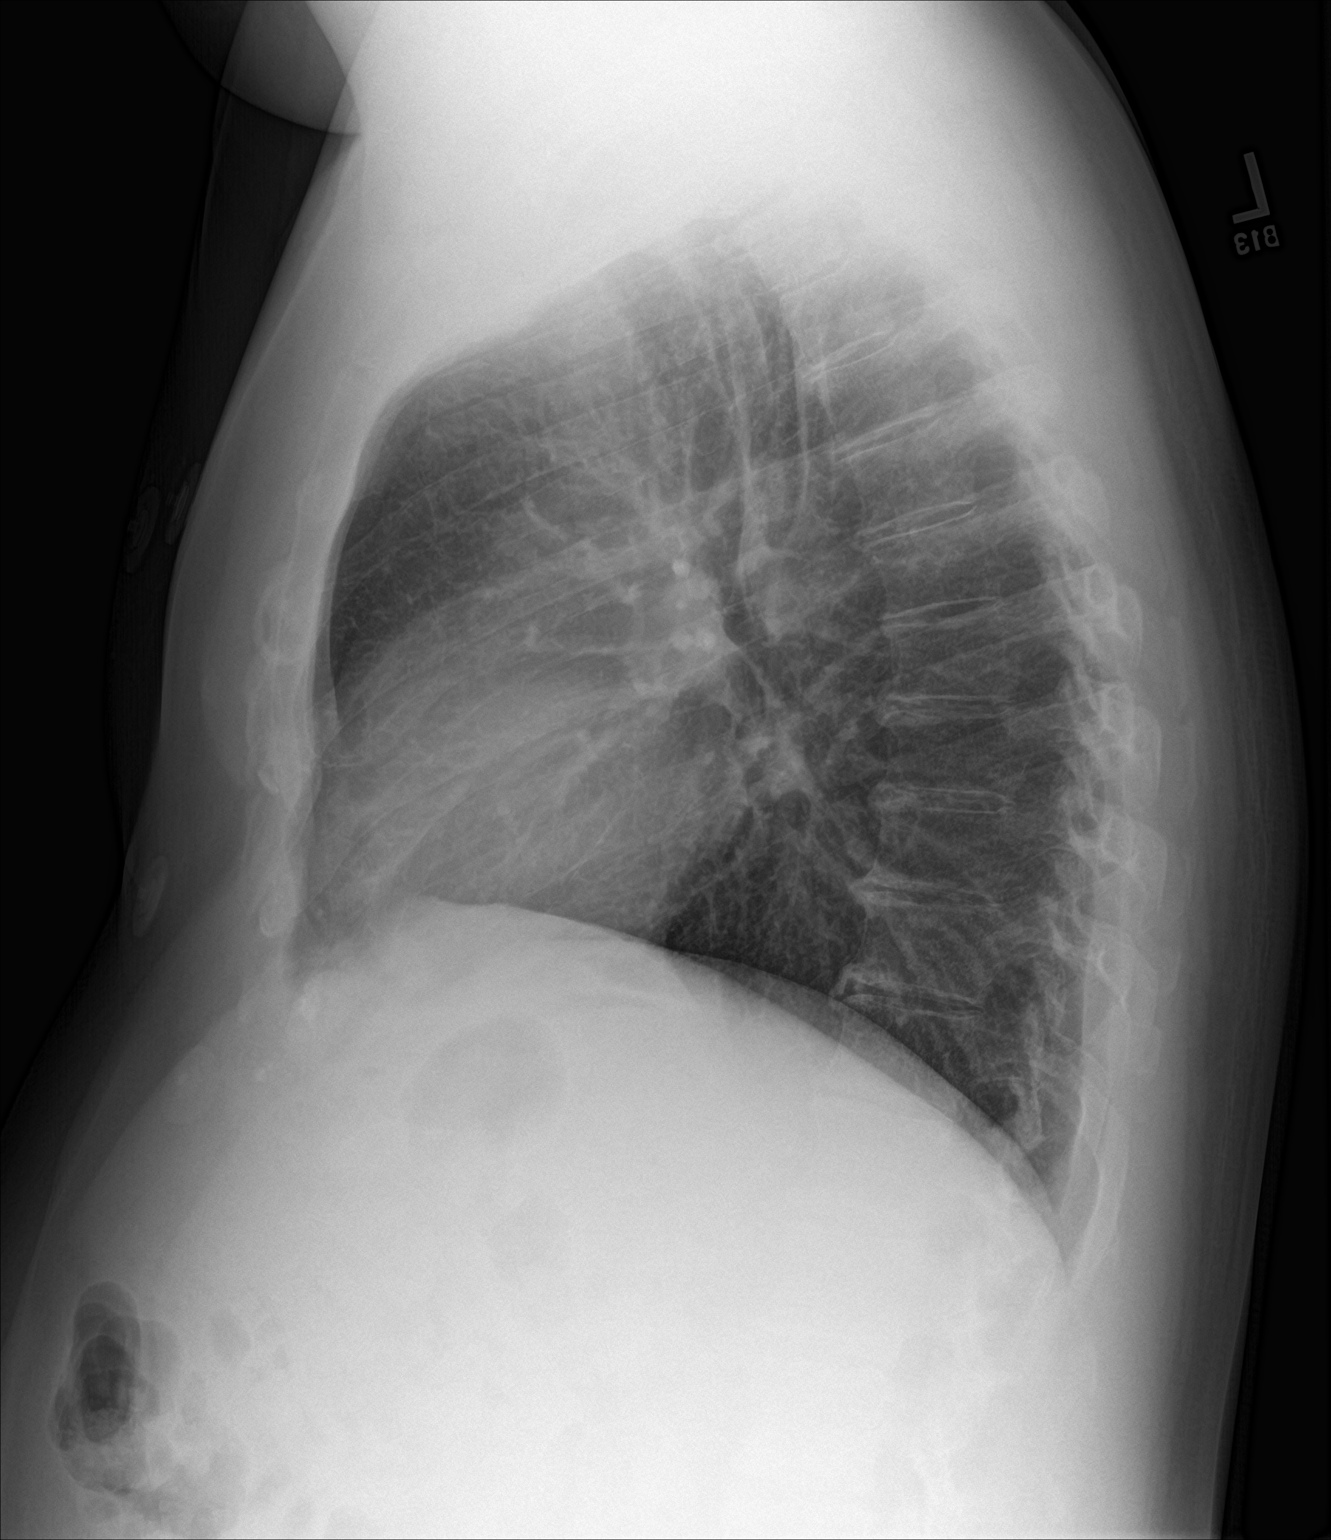

[2 of 2 positions shown; findings below may reference images not displayed]

FINDINGS: Midline trachea.  Normal heart size and mediastinal contours.

Sharp costophrenic angles.  No pneumothorax.  Clear lungs.
IMPRESSION: No active cardiopulmonary disease.

## 2018-10-31 IMAGING — CT CT HEAD W/O CM
3 series · 16 of 47 positions shown, 19 images · non-contrast
Comparison: MRI 08/25/2017, CT brain 06/13/2017

CLINICAL DATA: Lightheaded

EXAM:
CT HEAD WITHOUT CONTRAST
TECHNIQUE: Contiguous axial images were obtained from the base of the skull
through the vertex without intravenous contrast.

[Series 2: head wo · axial · 0.43mm/px · z∈[+1238,+1382]mm · 10 of 35 slices shown, 13 images]
[im 3/35  brain]
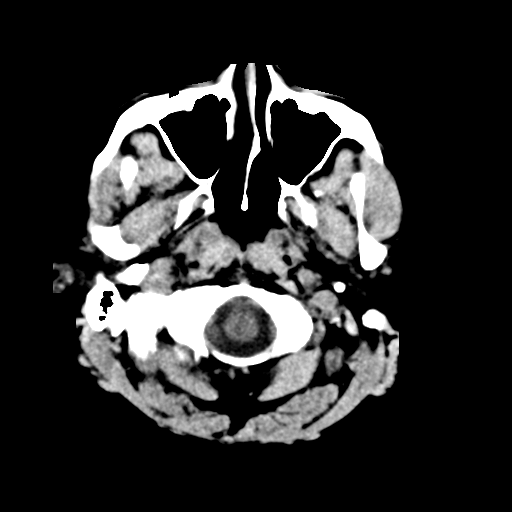
[im 3/35  bone]
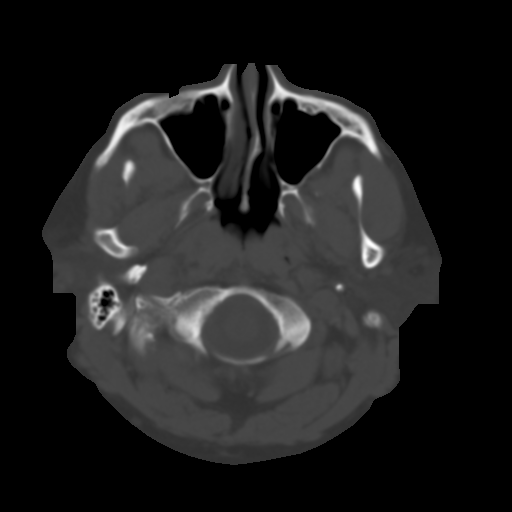
[im 6/35  brain]
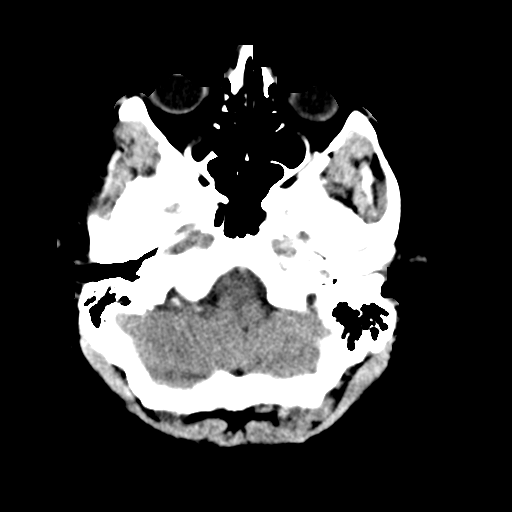
[im 10/35  brain]
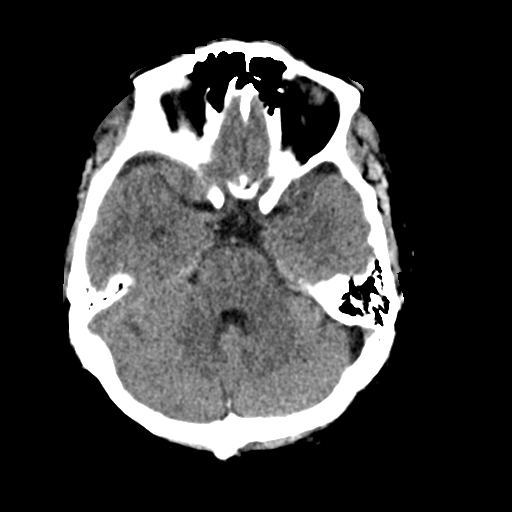
[im 12/35  brain]
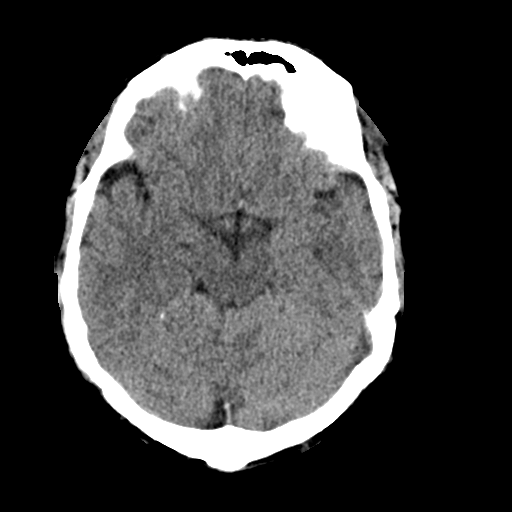
[im 16/35  brain]
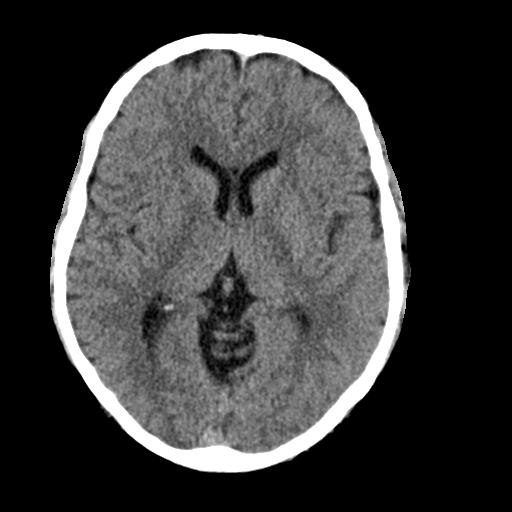
[im 16/35  bone]
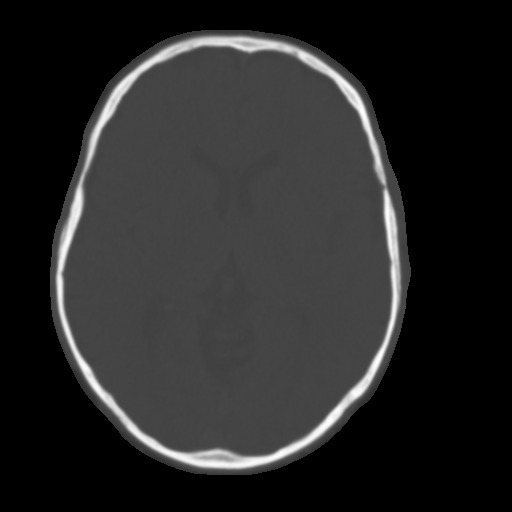
[im 19/35  brain]
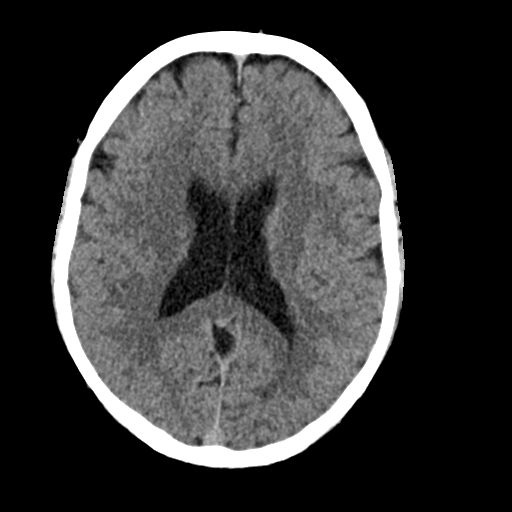
[im 23/35  brain]
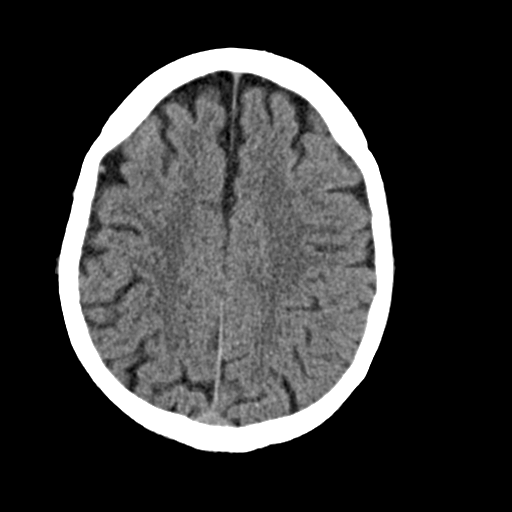
[im 26/35  brain]
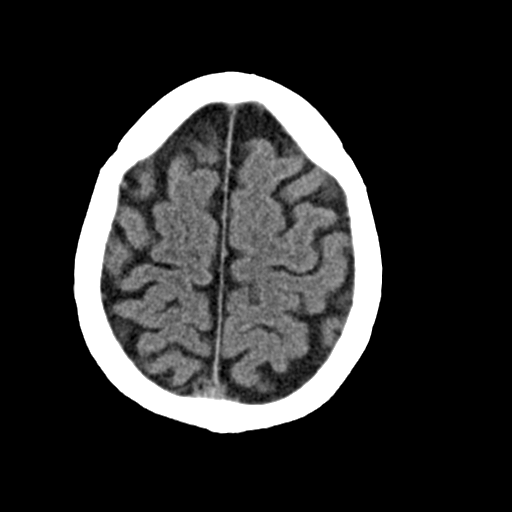
[im 29/35  brain]
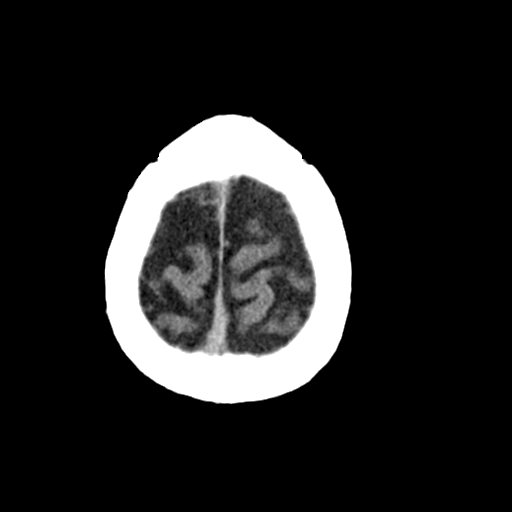
[im 29/35  bone]
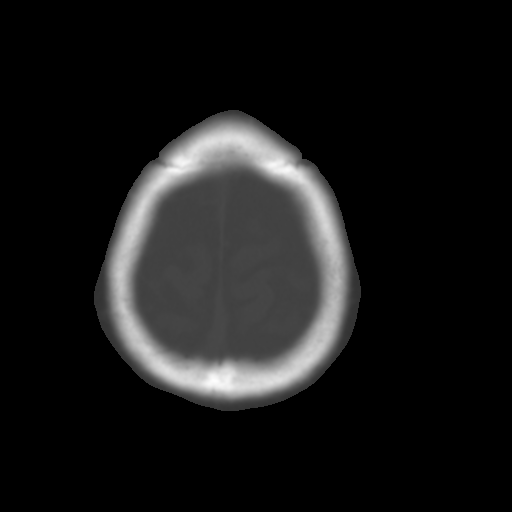
[im 32/35  brain]
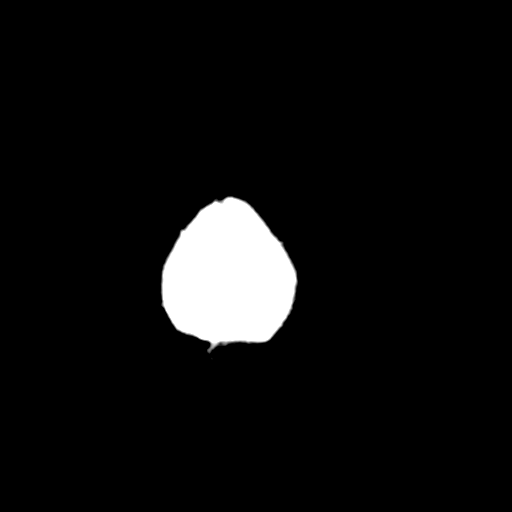

[Series 4: coronal soft · coronal · 0.34mm/px · 3 of 67 slices shown]
[im 23/67  brain]
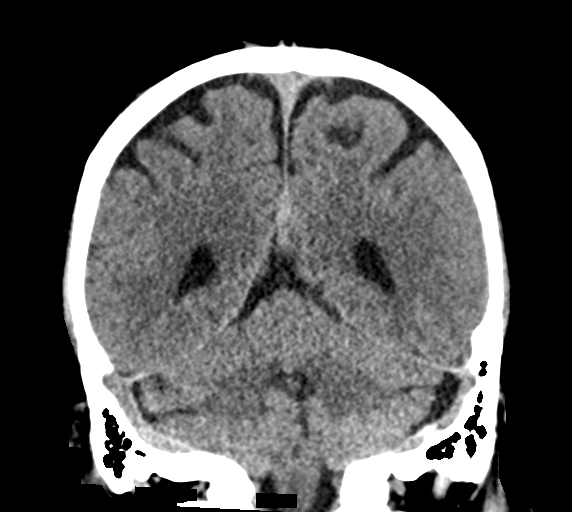
[im 30/67  brain]
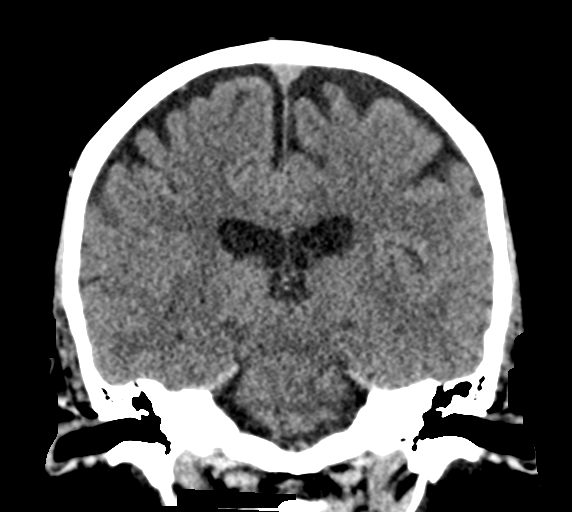
[im 37/67  brain]
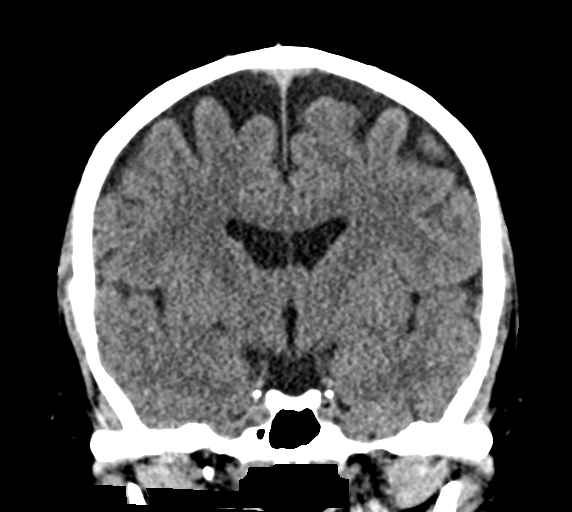

[Series 5: sag soft · sagittal · 0.34mm/px · 3 of 65 slices shown]
[im 22/65  brain]
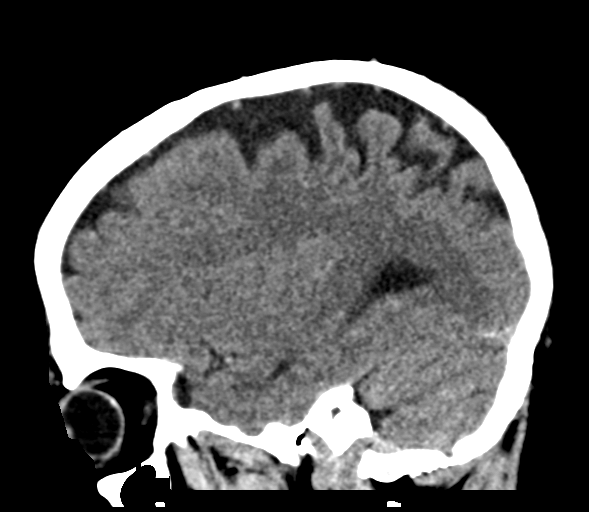
[im 33/65  brain]
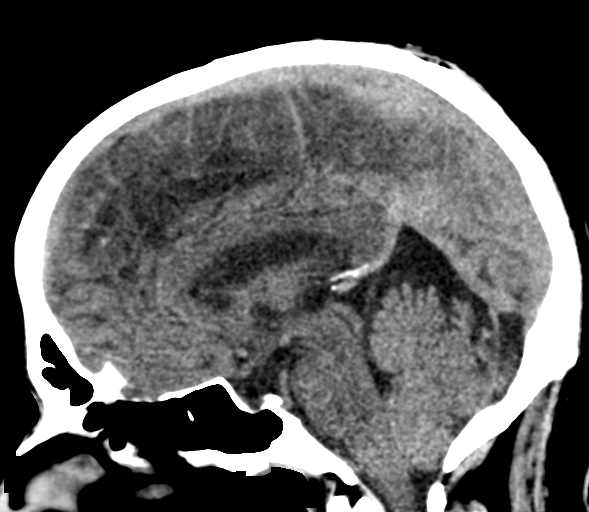
[im 43/65  brain]
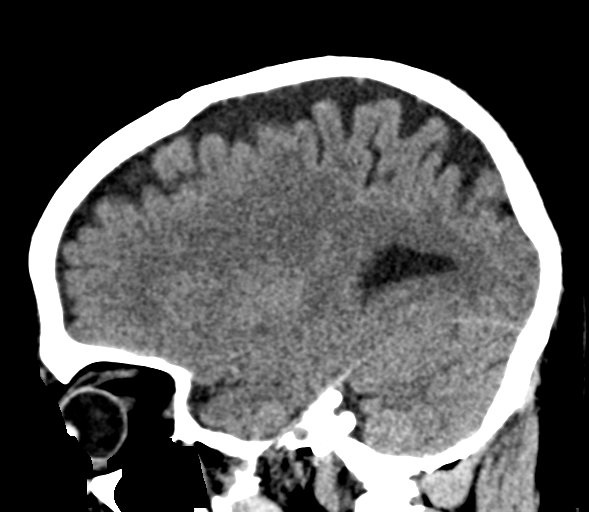

[16 of 47 positions shown; findings below may reference images not displayed]

FINDINGS: Brain: No acute territorial infarction, hemorrhage or intracranial
mass. Mild atrophy. Stable ventricle size

Vascular: No hyperdense vessels.  No unexpected calcification

Skull: Normal. Negative for fracture or focal lesion.

Sinuses/Orbits: Mucous retention cyst right maxillary sinus. No
acute orbital abnormality

Other: None
IMPRESSION: 1. No CT evidence for acute intracranial abnormality.
2. Mild atrophy
